# Patient Record
Sex: Female | Born: 1990 | Race: Black or African American | Hispanic: No | Marital: Single | Smoking: Current every day smoker
Health system: Southern US, Community
[De-identification: ages and names within clinical notes are randomized; demographics above are authoritative.]

## PROBLEM LIST (undated history)

## (undated) DIAGNOSIS — K5900 Constipation, unspecified: Secondary | ICD-10-CM

## (undated) DIAGNOSIS — N730 Acute parametritis and pelvic cellulitis: Secondary | ICD-10-CM

---
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
## 2010-05-08 ENCOUNTER — Emergency Department (HOSPITAL_COMMUNITY)
Admission: EM | Admit: 2010-05-08 | Discharge: 2010-05-08 | Disposition: A | Discharge status: Home / Self Care | Payer: No Typology Code available for payment source | Attending: Emergency Medicine | Admitting: Emergency Medicine

## 2010-05-08 DIAGNOSIS — F1210 Cannabis abuse, uncomplicated: Secondary | ICD-10-CM | POA: Insufficient documentation

## 2010-05-08 DIAGNOSIS — T148XXA Other injury of unspecified body region, initial encounter: Secondary | ICD-10-CM | POA: Insufficient documentation

## 2010-05-08 DIAGNOSIS — M25559 Pain in unspecified hip: Secondary | ICD-10-CM | POA: Insufficient documentation

## 2010-05-08 DIAGNOSIS — F17200 Nicotine dependence, unspecified, uncomplicated: Secondary | ICD-10-CM | POA: Insufficient documentation

## 2010-05-08 DIAGNOSIS — S7010XA Contusion of unspecified thigh, initial encounter: Secondary | ICD-10-CM | POA: Insufficient documentation

## 2010-05-13 ENCOUNTER — Emergency Department (HOSPITAL_COMMUNITY)
Admission: EM | Admit: 2010-05-13 | Discharge: 2010-05-13 | Disposition: A | Discharge status: Home / Self Care | Payer: No Typology Code available for payment source | Attending: Emergency Medicine | Admitting: Emergency Medicine

## 2010-05-13 ENCOUNTER — Emergency Department (HOSPITAL_COMMUNITY): Payer: No Typology Code available for payment source

## 2010-05-13 DIAGNOSIS — Y92410 Unspecified street and highway as the place of occurrence of the external cause: Secondary | ICD-10-CM | POA: Insufficient documentation

## 2010-05-13 DIAGNOSIS — IMO0002 Reserved for concepts with insufficient information to code with codable children: Secondary | ICD-10-CM | POA: Insufficient documentation

## 2010-05-13 DIAGNOSIS — S139XXA Sprain of joints and ligaments of unspecified parts of neck, initial encounter: Secondary | ICD-10-CM | POA: Insufficient documentation

## 2010-05-13 DIAGNOSIS — M25519 Pain in unspecified shoulder: Secondary | ICD-10-CM | POA: Insufficient documentation

## 2012-03-15 ENCOUNTER — Encounter (HOSPITAL_COMMUNITY): Payer: Self-pay | Admitting: *Deleted

## 2012-03-15 ENCOUNTER — Emergency Department (INDEPENDENT_AMBULATORY_CARE_PROVIDER_SITE_OTHER)
Admission: EM | Admit: 2012-03-15 | Discharge: 2012-03-15 | Disposition: A | Discharge status: Home / Self Care | Payer: Self-pay | Source: Home / Self Care

## 2012-03-15 ENCOUNTER — Other Ambulatory Visit (HOSPITAL_COMMUNITY)
Admission: RE | Admit: 2012-03-15 | Discharge: 2012-03-15 | Disposition: A | Discharge status: Home / Self Care | Payer: Self-pay | Source: Ambulatory Visit | Attending: Family Medicine | Admitting: Family Medicine

## 2012-03-15 DIAGNOSIS — Z113 Encounter for screening for infections with a predominantly sexual mode of transmission: Secondary | ICD-10-CM | POA: Insufficient documentation

## 2012-03-15 DIAGNOSIS — N730 Acute parametritis and pelvic cellulitis: Secondary | ICD-10-CM

## 2012-03-15 DIAGNOSIS — N760 Acute vaginitis: Secondary | ICD-10-CM | POA: Insufficient documentation

## 2012-03-15 LAB — POCT URINALYSIS DIP (DEVICE)
Bilirubin Urine: NEGATIVE
Glucose, UA: NEGATIVE mg/dL
Hgb urine dipstick: NEGATIVE
Nitrite: NEGATIVE
Urobilinogen, UA: 0.2 mg/dL (ref 0.0–1.0)

## 2012-03-15 MED ORDER — AZITHROMYCIN 250 MG PO TABS: 1000.0000 mg | ORAL_TABLET | Freq: Every day | ORAL | Status: DC

## 2012-03-15 MED ORDER — CEFTRIAXONE SODIUM 250 MG IJ SOLR
250.0000 mg | Freq: Once | INTRAMUSCULAR | Status: AC
  Administered 2012-03-15: 250 mg via INTRAMUSCULAR

## 2012-03-15 MED ORDER — AZITHROMYCIN 250 MG PO TABS
1000.0000 mg | ORAL_TABLET | Freq: Once | ORAL | Status: AC
  Administered 2012-03-15: 1000 mg via ORAL

## 2012-03-15 MED ORDER — CEFTRIAXONE SODIUM 250 MG IJ SOLR: 250.0000 mg | Freq: Once | INTRAMUSCULAR | Status: DC

## 2012-03-15 MED ORDER — METRONIDAZOLE 500 MG PO TABS: 500.0000 mg | ORAL_TABLET | Freq: Two times a day (BID) | ORAL | Status: DC

## 2012-03-15 MED ORDER — AZITHROMYCIN 250 MG PO TABS
ORAL_TABLET | ORAL | Status: AC
  Filled 2012-03-15: qty 4

## 2012-03-15 MED ORDER — CEFTRIAXONE SODIUM 250 MG IJ SOLR
INTRAMUSCULAR | Status: AC
  Filled 2012-03-15: qty 250

## 2012-03-15 NOTE — ED Notes (Signed)
Pt reports    Symptoms  Of  Rectal     Pain  unreleived  By         OTC  Ointment  As  Well  As       Lower  abd  Cramping     And  A  Vaginal  Discharge  As  Well    Pt  Walked  uright with a  Steady  Fluid  Gait  She  Also reports    Pain in legs  But  denys  Any  Injury

## 2012-03-15 NOTE — ED Provider Notes (Signed)
History     CSN: [id number]  Arrival date & time 03/15/12  1310   First MD Initiated Contact with Patient 03/15/12 1453      Chief Complaint  Patient presents with  . Vaginal Discharge    (Consider location/radiation/quality/duration/timing/severity/associated sxs/prior treatment) HPI Comments: 22-year-old female states that she had a small sore in her buttock crack a few days ago and it hurts when she coughs. She thinks is a little larger now. She also is complaining of pelvic pain and cramping for approximately one month associated with a vaginal discharge. She denies urinary symptoms such as dysuria or urinary frequency. She denies fever, vomiting, diarrhea or constipation.   History reviewed. No pertinent past medical history.  History reviewed. No pertinent past surgical history.  No family history on file.  History  Substance Use Topics  . Smoking status: Current Every Day Smoker  . Smokeless tobacco: Not on file  . Alcohol Use: Yes    OB History   Grav Para Term Preterm Abortions TAB SAB Ect Mult Living                  Review of Systems  Constitutional: Negative.   HENT: Negative.   Respiratory: Negative.   Cardiovascular: Negative.   Gastrointestinal: Positive for rectal pain. Negative for nausea, vomiting, abdominal pain, diarrhea, constipation, abdominal distention and anal bleeding.  Genitourinary: Positive for vaginal discharge and pelvic pain. Negative for dysuria, urgency, frequency and decreased urine volume.  Musculoskeletal: Negative.   Skin: Negative.   Neurological: Negative.   Psychiatric/Behavioral: Negative.     Allergies  Review of patient's allergies indicates not on file.  Home Medications   Current Outpatient Rx  Name  Route  Sig  Dispense  Refill  . cefTRIAXone (ROCEPHIN) 250 MG injection   Intramuscular   Inject 250 mg into the muscle once.  FOR IM use in LARGE MUSCLE MASS   1 each   0     BP 130/84  Pulse 71  Temp(Src) 98  F (36.7 C) (Oral)  Resp 20  SpO2 100%  LMP 02/29/2012  Physical Exam  Nursing note and vitals reviewed. Constitutional: She is oriented to person, place, and time. She appears well-developed and well-nourished. No distress.  Eyes: Conjunctivae and EOM are normal.  Neck: Normal range of motion.  Cardiovascular: Normal rate, regular rhythm and normal heart sounds.   Pulmonary/Chest: Breath sounds normal. She is in respiratory distress.  Abdominal: Soft. She exhibits no distension and no mass. There is no tenderness. There is no rebound and no guarding.  Genitourinary:  Mild tenderness with external pelvic exam. Manual pressure over the suprapubic area and right pelvis produces mild tenderness. NEFG, Copious amount pale green , thin discharge coating vaginal walls and cervix. Cervix posterior and left of midline. Ectocervix red, inflammed at 6 o clock position. R adnexal tendrness, no masses.   Anorectal without lesions, bleeding, swelling, nodules, discoloration or evidence of hemorrhoids. Normal exam.  Lymphadenopathy:    She has no cervical adenopathy.  Neurological: She is alert and oriented to person, place, and time. She exhibits normal muscle tone.  Skin: Skin is warm and dry.  Psychiatric: She has a normal mood and affect.    ED Course  Procedures (including critical care time)  Labs Reviewed  POCT URINALYSIS DIP (DEVICE) - Abnormal; Notable for the following:    Leukocytes, UA MODERATE (*)    All other components within normal limits  POCT PREGNANCY, URINE  URINE CYTOLOGY ANCILLARY  ONLY   No results found. Results for orders placed during the hospital encounter of 03/15/12  POCT URINALYSIS DIP (DEVICE)      Result Value Range   Glucose, UA NEGATIVE  NEGATIVE mg/dL   Bilirubin Urine NEGATIVE  NEGATIVE   Ketones, ur NEGATIVE  NEGATIVE mg/dL   Specific Gravity, Urine 1.015  1.005 - 1.030   Hgb urine dipstick NEGATIVE  NEGATIVE   pH 7.0  5.0 - 8.0   Protein, ur  NEGATIVE  NEGATIVE mg/dL   Urobilinogen, UA 0.2  0.0 - 1.0 mg/dL   Nitrite NEGATIVE  NEGATIVE   Leukocytes, UA MODERATE (*) NEGATIVE  POCT PREGNANCY, URINE      Result Value Range   Preg Test, Ur NEGATIVE  NEGATIVE      1. PID (acute pelvic inflammatory disease)       MDM  Rocephin 250 mg IM now Azithromycin 1 g by mouth now Obtained swabs for PCR testing Advil 400 600 mg every 6-8 hours when necessary abdominal pain or cramping. Drink plenty of fluids stay well hydrated We will call results of swab testing and treat over the phone as needed. For any new symptoms problems or worsening such as fever increased abdominal or pelvic pain, vomiting or other symptoms go to Emergency department.  03/16/12. Positive for BV. FLagyl 500 bid for 7 d.        Hayden Rasmussen, NP 03/15/12 2115  Hayden Rasmussen, NP 03/17/12 [phone]

## 2012-03-19 ENCOUNTER — Telehealth (HOSPITAL_COMMUNITY): Payer: Self-pay | Admitting: *Deleted

## 2012-03-19 NOTE — ED Notes (Signed)
3/14  GC/Chlamydia pos., Affirm: Gardnerella pos., Candida and Trich neg.  Pt. adequately treated with Rocephin, Zithromax and Flagyl.  I called pt. and left a message to call.  3/16  Pt. called back.  Pt. verified x 2 and given results. Pt. told she was adequately treated with Rocephin, Zithromax and Flagyl.  Pt. instructed to notify her partner, no sex for 1 week and to practice safe sex. Pt. told she can get HIV testing at the [facility] STD clinic, by appointment.  Pt. states she had unprotected sex with same partner last night. Pt. instructed to get retested here or at the [facility] and no sex until partner is treated as well.  DHHS form x 2 completed and faxed to the [facility]. Vassie Moselle 03/19/2012

## 2012-03-21 NOTE — ED Provider Notes (Signed)
Medical screening examination/treatment/procedure(s) were performed by resident physician or non-physician practitioner and as supervising physician I was immediately available for consultation/collaboration.   Zannie Runkle DOUGLAS MD.   Niccole Witthuhn D Masato Pettie, MD 03/21/12 1146 

## 2012-05-09 ENCOUNTER — Encounter (HOSPITAL_COMMUNITY): Payer: Self-pay | Admitting: *Deleted

## 2012-05-09 ENCOUNTER — Emergency Department (HOSPITAL_COMMUNITY)
Admission: EM | Admit: 2012-05-09 | Discharge: 2012-05-10 | Disposition: A | Discharge status: Home / Self Care | Payer: Self-pay | Attending: Emergency Medicine | Admitting: Emergency Medicine

## 2012-05-09 ENCOUNTER — Emergency Department (HOSPITAL_COMMUNITY): Payer: Self-pay

## 2012-05-09 DIAGNOSIS — J159 Unspecified bacterial pneumonia: Secondary | ICD-10-CM | POA: Insufficient documentation

## 2012-05-09 DIAGNOSIS — J189 Pneumonia, unspecified organism: Secondary | ICD-10-CM

## 2012-05-09 DIAGNOSIS — Z8742 Personal history of other diseases of the female genital tract: Secondary | ICD-10-CM | POA: Insufficient documentation

## 2012-05-09 DIAGNOSIS — R071 Chest pain on breathing: Secondary | ICD-10-CM | POA: Insufficient documentation

## 2012-05-09 DIAGNOSIS — F17200 Nicotine dependence, unspecified, uncomplicated: Secondary | ICD-10-CM | POA: Insufficient documentation

## 2012-05-09 DIAGNOSIS — R0789 Other chest pain: Secondary | ICD-10-CM

## 2012-05-09 LAB — D-DIMER, QUANTITATIVE: D-Dimer, Quant: 0.79 ug/mL-FEU — ABNORMAL HIGH (ref 0.00–0.48)

## 2012-05-09 MED ORDER — IOHEXOL 350 MG/ML SOLN
100.0000 mL | Freq: Once | INTRAVENOUS | Status: AC | PRN
  Administered 2012-05-09: 100 mL via INTRAVENOUS

## 2012-05-09 MED ORDER — OXYCODONE-ACETAMINOPHEN 5-325 MG PO TABS
2.0000 | ORAL_TABLET | Freq: Once | ORAL | Status: AC
  Administered 2012-05-09: 2 via ORAL
  Filled 2012-05-09: qty 2

## 2012-05-09 NOTE — ED Provider Notes (Signed)
History    This chart was scribed for non-physician practitioner Dierdre Forth, PA-C working with Gerhard Munch, MD by Gerlean Ren, ED Scribe. This patient was seen in room WTR8/WTR8 and the patient's care was started at 9:20 PM.    CSN: [id number]  Arrival date & time 05/09/12  2037   First MD Initiated Contact with Patient 05/09/12 2106      Chief Complaint  Patient presents with  . Rib Pain      The history is provided by the patient. No language interpreter was used.  Catherine Meyer is a 22 y.o. female who presents to the Emergency Department complaining of right side rib aching pain localized under the right breast that began at 7:00 PM not during any heavy lifting or any unusual or exertional activities.  Pain is worsened when laughing and taking deep breaths.  Pt denies any true SOB, pain in lower extremities or calves, abdominal pain, nausea, emesis.  No h/o DVT or PE.  Pt denies recent illness.  No recent surgeries.  No birth control.  No recent long trips.  No unusual foods eaten today.  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  Pt is a current everyday smoker.  No PCP.     History reviewed. No pertinent past medical history.  History reviewed. No pertinent past surgical history.  History reviewed. No pertinent family history.  History  Substance Use Topics  . Smoking status: Current Every Day Smoker    Types: Cigarettes  . Smokeless tobacco: Not on file  . Alcohol Use: Yes    No OB history provided.   Review of Systems  Constitutional: Negative for fever, diaphoresis, appetite change, fatigue and unexpected weight change.  HENT: Negative for mouth sores and neck stiffness.   Eyes: Negative for visual disturbance.  Respiratory: Positive for chest tightness. Negative for cough, shortness of breath and wheezing.   Cardiovascular: Positive for chest pain.  Gastrointestinal: Negative for nausea, vomiting, abdominal pain, diarrhea and constipation.  Endocrine:  Negative for polydipsia, polyphagia and polyuria.  Genitourinary: Negative for dysuria, urgency, frequency and hematuria.  Musculoskeletal: Negative for back pain.       Positive right rib pain  Skin: Negative for rash.  Allergic/Immunologic: Negative for immunocompromised state.  Neurological: Negative for syncope, light-headedness and headaches.  Hematological: Does not bruise/bleed easily.  Psychiatric/Behavioral: Negative for sleep disturbance. The patient is not nervous/anxious.     Allergies  Review of patient's allergies indicates no known allergies.  Home Medications   Current Outpatient Rx  Name  Route  Sig  Dispense  Refill  . levofloxacin (LEVAQUIN) 750 MG tablet   Oral   Take 1 tablet (750 mg total) by mouth daily. X 7 days   7 tablet   0   . oxyCODONE-acetaminophen (PERCOCET/ROXICET) 5-325 MG per tablet   Oral   Take 1 tablet by mouth every 4 (four) hours as needed for pain.   10 tablet   0     BP 138/89  Pulse 97  Temp(Src) 98.3 F (36.8 C) (Oral)  Resp 21  Ht 5\' 3"  (1.6 m)  SpO2 100%  LMP 05/01/2012  Physical Exam  Nursing note and vitals reviewed. Constitutional: She is oriented to person, place, and time. She appears well-developed and well-nourished. She appears distressed.  Pt tearful throughout much of exam and very restless and uncomfortable feeling  HENT:  Head: Normocephalic and atraumatic.  Mouth/Throat: Oropharynx is clear and moist. No oropharyngeal exudate.  Eyes: Conjunctivae are normal.  Pupils are equal, round, and reactive to light. No scleral icterus.  Neck: Normal range of motion. Neck supple.  Cardiovascular: Normal rate, regular rhythm and intact distal pulses.   Pulmonary/Chest: Effort normal and breath sounds normal. No respiratory distress. She has no wheezes. She has no rales. She exhibits tenderness (very mild - pts chest pain is out of proportion to her exam and to the pain felt during exam).  Pt taking shallow breaths Pt  without tachypnea  Abdominal: Soft. Bowel sounds are normal. She exhibits no mass. There is no tenderness. There is no rebound and no guarding.  Musculoskeletal: Normal range of motion. She exhibits no edema.  Neurological: She is alert and oriented to person, place, and time. She exhibits normal muscle tone. Coordination normal.  Speech is clear and goal oriented Moves extremities without ataxia  Skin: Skin is warm and dry. No rash noted. She is not diaphoretic. No erythema.  Psychiatric: She has a normal mood and affect. Her behavior is normal.    ED Course  Procedures (including critical care time) DIAGNOSTIC STUDIES: Oxygen Saturation is 100% on room air, normal by my interpretation.    COORDINATION OF CARE: 9:28 PM- Informed pt that the XR is negative for fractures and that I have very low suspicion of PE, but that I will discuss case with attending physician to determine further treatment plan.  Pt verbalizes understanding.      Results for orders placed during the hospital encounter of 05/09/12  D-DIMER, QUANTITATIVE      Result Value Range   D-Dimer, Quant 0.79 (*) 0.00 - 0.48 ug/mL-FEU   Dg Ribs Unilateral W/chest Right  05/09/2012  *RADIOLOGY REPORT*  Clinical Data: Anterior rib pain  RIGHT RIBS AND CHEST - 3+ VIEW  Comparison: None.  Findings: Normal mediastinum and heart silhouette.  Costophrenic angles are clear.  No effusion or pneumothorax.  Dedicated views of the right ribs demonstrate no displaced rib fracture.  IMPRESSION: Normal chest radiograph.  No rib abnormality.   Original Report Authenticated By: Genevive Bi, M.D.    Ct Angio Chest Pe W/cm &/or Wo Cm  05/09/2012  *RADIOLOGY REPORT*  Clinical Data: Right-sided chest pain.  Concern for pulmonary embolism.  CT ANGIOGRAPHY CHEST  Technique:  Multidetector CT imaging of the chest using the standard protocol during bolus administration of intravenous contrast. Multiplanar reconstructed images including MIPs were  obtained and reviewed to evaluate the vascular anatomy.  Contrast: OMNIPAQUE IOHEXOL 350 MG/ML SOLN  Comparison: Chest radiograph 05/09/2012  Findings: There is no filling defects within the pulmonary arteries suggest acute pulmonary embolism.  No acute findings of the aorta or great vessels.  No pericardial fluid.  Esophagus is normal.  No axillary supraclavicular lymphadenopathy.  No mediastinal or hilar lymphadenopathy.  Review of the lung parenchyma demonstrates no pleural fluid or pneumothorax.  There is a subtle regional ground-glass opacity in the right lower lobe (image 53).  No focal consolidation.  Limited view of the upper abdomen is unremarkable.  Limited view of the skeleton is unremarkable.  IMPRESSION:  1.  No evidence acute pulmonary embolism. 2.  Regional ground-glass opacity in the right lower lobe could represent a focus of focal edema,   inflammation or infection.   Original Report Authenticated By: Genevive Bi, M.D.       1. Community acquired pneumonia   2. Chest wall pain       MDM  Andria Rhein presents with history and physical consistent with chest wall pain  however on exam pt is restless, appears uncomfortable and pt's pain is out of proportion to her exam as she only has mild chest wall tenderness to palpation.  Pt is without tachycardia, tachypnea or hypoxia but concern for PE or underlying pathology is present.  CXR is negative for acute infiltrate or edema.  Will provide pain control and obtain d-dimer.    Pt d-dimer positive, she is tearful on re-exam with c/o increased pain though analgesic was just given.    CT chest with regional ground-glass opacity in the right lower lobe could represent a focus of focal edema, inflammation or infection.  Given the pts unremarkable medical Hx and denial of HIV risk factors we will treat with outpatient abx and have her f/u with [facility] for re-evaluation and further discussion of her CT findings.  Pt  remains without hypoxia, tachycardia or tachypnea. I have also discussed reasons to return immediately to the ER.  Patient expresses understanding and agrees with plan.  Dr. Gerhard Munch was consulted and agrees with the plan.     I personally performed the services described in this documentation, which was scribed in my presence. The recorded information has been reviewed and is accurate.   Dahlia Client Brynlyn Dade, PA-C 05/10/12 [phone]

## 2012-05-09 NOTE — ED Notes (Signed)
Pt transferred to room for continued care.

## 2012-05-09 NOTE — ED Notes (Signed)
Pt c/o pain to right rib cage. Reports pain started while at work. Denies SOB. Reports pain is worse with deep breath. Pt tearful in triage.

## 2012-05-10 MED ORDER — LEVOFLOXACIN 750 MG PO TABS: 750.0000 mg | ORAL_TABLET | Freq: Every day | ORAL | Status: DC

## 2012-05-10 MED ORDER — OXYCODONE-ACETAMINOPHEN 5-325 MG PO TABS: 1.0000 | ORAL_TABLET | ORAL | Status: DC | PRN

## 2012-05-11 NOTE — ED Provider Notes (Signed)
  Medical screening examination/treatment/procedure(s) were performed by non-physician practitioner and as supervising physician I was immediately available for consultation/collaboration.    Gerhard Munch, MD 05/11/12 [phone]

## 2012-07-31 DIAGNOSIS — Z3202 Encounter for pregnancy test, result negative: Secondary | ICD-10-CM | POA: Insufficient documentation

## 2012-07-31 DIAGNOSIS — F17200 Nicotine dependence, unspecified, uncomplicated: Secondary | ICD-10-CM | POA: Insufficient documentation

## 2012-07-31 DIAGNOSIS — R109 Unspecified abdominal pain: Secondary | ICD-10-CM | POA: Insufficient documentation

## 2012-08-01 ENCOUNTER — Emergency Department (HOSPITAL_COMMUNITY)
Admission: EM | Admit: 2012-08-01 | Discharge: 2012-08-01 | Disposition: A | Discharge status: Home / Self Care | Payer: Self-pay | Attending: Emergency Medicine | Admitting: Emergency Medicine

## 2012-08-01 ENCOUNTER — Encounter (HOSPITAL_COMMUNITY): Payer: Self-pay | Admitting: *Deleted

## 2012-08-01 ENCOUNTER — Emergency Department (HOSPITAL_COMMUNITY): Payer: Self-pay

## 2012-08-01 DIAGNOSIS — R109 Unspecified abdominal pain: Secondary | ICD-10-CM

## 2012-08-01 LAB — URINALYSIS, ROUTINE W REFLEX MICROSCOPIC
Glucose, UA: NEGATIVE mg/dL
Specific Gravity, Urine: 1.024 (ref 1.005–1.030)
Urobilinogen, UA: 1 mg/dL (ref 0.0–1.0)
pH: 6 (ref 5.0–8.0)

## 2012-08-01 LAB — CBC WITH DIFFERENTIAL/PLATELET
Basophils Relative: 0 % (ref 0–1)
Hemoglobin: 12 g/dL (ref 12.0–15.0)
Lymphocytes Relative: 19 % (ref 12–46)
Lymphs Abs: 1.7 10*3/uL (ref 0.7–4.0)
Monocytes Relative: 10 % (ref 3–12)
Neutro Abs: 6 10*3/uL (ref 1.7–7.7)
Neutrophils Relative %: 70 % (ref 43–77)
RBC: 4.27 MIL/uL (ref 3.87–5.11)

## 2012-08-01 LAB — COMPREHENSIVE METABOLIC PANEL
ALT: 9 U/L (ref 0–35)
AST: 15 U/L (ref 0–37)
CO2: 24 mEq/L (ref 19–32)
Calcium: 9 mg/dL (ref 8.4–10.5)
Creatinine, Ser: 0.64 mg/dL (ref 0.50–1.10)
GFR calc Af Amer: >90 mL/min (ref >90)
GFR calc non Af Amer: >90 mL/min (ref >90)
Sodium: 137 mEq/L (ref 135–145)
Total Protein: 7.5 g/dL (ref 6.0–8.3)

## 2012-08-01 LAB — POCT PREGNANCY, URINE: Preg Test, Ur: NEGATIVE

## 2012-08-01 LAB — LIPASE, BLOOD: Lipase: 13 U/L (ref 11–59)

## 2012-08-01 LAB — URINE MICROSCOPIC-ADD ON

## 2012-08-01 MED ORDER — IOHEXOL 300 MG/ML  SOLN
25.0000 mL | INTRAMUSCULAR | Status: AC
  Administered 2012-08-01: 25 mL via ORAL

## 2012-08-01 MED ORDER — MAGNESIUM CITRATE PO SOLN
1.0000 | Freq: Once | ORAL | Status: AC
  Administered 2012-08-01: 1 via ORAL
  Filled 2012-08-01: qty 296

## 2012-08-01 MED ORDER — SODIUM CHLORIDE 0.9 % IV SOLN
INTRAVENOUS | Status: DC
  Administered 2012-08-01: 05:00:00 via INTRAVENOUS

## 2012-08-01 MED ORDER — IOHEXOL 300 MG/ML  SOLN
100.0000 mL | Freq: Once | INTRAMUSCULAR | Status: AC | PRN
  Administered 2012-08-01: 100 mL via INTRAVENOUS

## 2012-08-01 NOTE — ED Notes (Signed)
MD at bedside. 

## 2012-08-01 NOTE — ED Provider Notes (Signed)
CSN: [id number]     Arrival date & time 07/31/12  2342 History     First MD Initiated Contact with Patient 08/01/12 [phone]     Chief Complaint  Patient presents with  . Abdominal Pain   (Consider location/radiation/quality/duration/timing/severity/associated sxs/prior Treatment) Patient is a 22 y.o. female presenting with abdominal pain. The history is provided by the patient.  Abdominal Pain Associated symptoms include abdominal pain.  She complains of a right upper quadrant pain which started about a week ago and is now starting to spread to the right or quadrant. Pain is dull and tends to wax and wane. She rates the current pain at 6/10 but it has been as severe as 8/10 and pain has in general been getting worse. It seems to be worse when she is supine. She's not noticed with her eating fracture or not. She denies nausea or vomiting. She denies fever, chills, sweats. She has not taken anything to try and help it. Similar pain in May of this year was diagnosed as pneumonia. She denies any cough but states that she did not have cough or fever when she had pneumonia. She denies any constipation or diarrhea.  History reviewed. No pertinent past medical history. History reviewed. No pertinent past surgical history. No family history on file. History  Substance Use Topics  . Smoking status: Current Every Day Smoker    Types: Cigarettes  . Smokeless tobacco: Not on file  . Alcohol Use: Yes   OB History   Grav Para Term Preterm Abortions TAB SAB Ect Mult Living                 Review of Systems  Gastrointestinal: Positive for abdominal pain.  All other systems reviewed and are negative.    Allergies  Review of patient's allergies indicates no known allergies.  Home Medications  No current outpatient prescriptions on file. BP 129/77  Pulse 86  Temp(Src) 98.1 F (36.7 C) (Oral)  Resp 18  SpO2 100%  LMP 07/30/2012 Physical Exam  Nursing note and vitals reviewed.  22 year old  female, resting comfortably and in no acute distress. Vital signs are normal. Oxygen saturation is 100%, which is normal. Head is normocephalic and atraumatic. PERRLA, EOMI. Oropharynx is clear. Neck is nontender and supple without adenopathy or JVD. Back is nontender and there is no CVA tenderness. Lungs are clear without rales, wheezes, or rhonchi. Chest is nontender. Heart has regular rate and rhythm without murmur. Abdomen is soft, flat, with moderate right upper quadrant and right lower quadrant tenderness. There is a plus/minus Murphy sign present, but maximum tenderness is over McBurney's area in the right lower cautery. There is no rebound or guarding. There are no masses or hepatosplenomegaly and peristalsis is normoactive. Extremities have no cyanosis or edema, full range of motion is present. Skin is warm and dry without rash. Neurologic: Mental status is normal, cranial nerves are intact, there are no motor or sensory deficits.  ED Course   Procedures (including critical care time)  Results for orders placed during the hospital encounter of 08/01/12  COMPREHENSIVE METABOLIC PANEL      Result Value Range   Sodium 137  135 - 145 mEq/L   Potassium 3.4 (*) 3.5 - 5.1 mEq/L   Chloride 104  96 - 112 mEq/L   CO2 24  19 - 32 mEq/L   Glucose, Bld 89  70 - 99 mg/dL   BUN 9  6 - 23 mg/dL   Creatinine, Ser  0.64  0.50 - 1.10 mg/dL   Calcium 9.0  8.4 - 09.8 mg/dL   Total Protein 7.5  6.0 - 8.3 g/dL   Albumin 4.0  3.5 - 5.2 g/dL   AST 15  0 - 37 U/L   ALT 9  0 - 35 U/L   Alkaline Phosphatase 67  39 - 117 U/L   Total Bilirubin 0.2 (*) 0.3 - 1.2 mg/dL   GFR calc non Af Amer >90  >90 mL/min   GFR calc Af Amer >90  >90 mL/min  LIPASE, BLOOD      Result Value Range   Lipase 13  11 - 59 U/L  CBC WITH DIFFERENTIAL      Result Value Range   WBC 8.6  4.0 - 10.5 K/uL   RBC 4.27  3.87 - 5.11 MIL/uL   Hemoglobin 12.0  12.0 - 15.0 g/dL   HCT 11.9  14.7 - 82.9 %   MCV 84.3  78.0 - 100.0 fL    MCH 28.1  26.0 - 34.0 pg   MCHC 33.3  30.0 - 36.0 g/dL   RDW 56.2  13.0 - 86.5 %   Platelets 247  150 - 400 K/uL   Neutrophils Relative % 70  43 - 77 %   Neutro Abs 6.0  1.7 - 7.7 K/uL   Lymphocytes Relative 19  12 - 46 %   Lymphs Abs 1.7  0.7 - 4.0 K/uL   Monocytes Relative 10  3 - 12 %   Monocytes Absolute 0.8  0.1 - 1.0 K/uL   Eosinophils Relative 1  0 - 5 %   Eosinophils Absolute 0.1  0.0 - 0.7 K/uL   Basophils Relative 0  0 - 1 %   Basophils Absolute 0.0  0.0 - 0.1 K/uL  URINALYSIS, ROUTINE W REFLEX MICROSCOPIC      Result Value Range   Color, Urine YELLOW  YELLOW   APPearance CLEAR  CLEAR   Specific Gravity, Urine 1.024  1.005 - 1.030   pH 6.0  5.0 - 8.0   Glucose, UA NEGATIVE  NEGATIVE mg/dL   Hgb urine dipstick LARGE (*) NEGATIVE   Bilirubin Urine NEGATIVE  NEGATIVE   Ketones, ur NEGATIVE  NEGATIVE mg/dL   Protein, ur NEGATIVE  NEGATIVE mg/dL   Urobilinogen, UA 1.0  0.0 - 1.0 mg/dL   Nitrite NEGATIVE  NEGATIVE   Leukocytes, UA NEGATIVE  NEGATIVE  URINE MICROSCOPIC-ADD ON      Result Value Range   Squamous Epithelial / LPF RARE  RARE   RBC / HPF 11-20  <3 RBC/hpf  POCT PREGNANCY, URINE      Result Value Range   Preg Test, Ur NEGATIVE  NEGATIVE   Ct Abdomen Pelvis W Contrast  08/01/2012   *RADIOLOGY REPORT*  Clinical Data: Abdominal pain.  Right upper quadrant pain since last Wednesday.  CT ABDOMEN AND PELVIS WITH CONTRAST  Technique:  Multidetector CT imaging of the abdomen and pelvis was performed following the standard protocol during bolus administration of intravenous contrast.  Contrast: OMNIPAQUE IOHEXOL 300 MG/ML  SOLN  Comparison: None.  Findings:  BODY WALL: Unremarkable.  LOWER CHEST:  Mediastinum: Unremarkable.  Lungs/pleura: No consolidation.  ABDOMEN/PELVIS:  Liver: No focal abnormality.  Biliary: No evidence of biliary obstruction or stone.  Pancreas: Unremarkable.  Spleen: Unremarkable.  Adrenals: Unremarkable.  Kidneys and ureters: No  hydronephrosis or stone.  Bladder: Unremarkable.  Bowel: No obstruction. Normal appendix.  Large stool burden.  Retroperitoneum: No mass  or adenopathy.  Peritoneum: No free fluid or gas.  Reproductive: Unremarkable.  Vascular: No acute abnormality.  OSSEOUS: No acute abnormalities.  IMPRESSION:  1. No acute intra-abdominal abnormality.  Normal appendix. 2.  Large stool burden.   Original Report Authenticated By: Tiburcio Pea     1. Abdominal pain     MDM  Abdominal pain of uncertain cause. Old records are reviewed and diagnosis of pneumonia was placed on a CT angiogram of the chest obtained because of a positive d-dimer. With positive Murphy sign, concern was present for biliary tract disease. Limited bedside ultrasound was done which showed normal gallbladder without evidence of cholelithiasis. Images were saved. It was decided to get a CT of her abdomen to rule out appendicitis. Initial laboratory workup is unremarkable including normal WBC. However, her maximum tenderness is over McBurney's area.  CT is negative for appendicitis and other significant pathology other than a large stool. It is possible that her pain is related to constipation. She was given a dose of magnesium citrate and discharged. She's reassured of the negative findings of her workup.  Dione Booze, MD 08/01/12 [phone]

## 2012-08-01 NOTE — ED Notes (Signed)
C/o RUQ pain, onset last Wednesday, also mentions sob and fever. (denies: urinary or vaginal sx, nvd, dizziness or bleeding), has tried ibuprofen w/o change in sx. Last ate 0200. Last BM Saturday (normal).

## 2012-09-12 ENCOUNTER — Emergency Department (HOSPITAL_COMMUNITY)
Admission: EM | Admit: 2012-09-12 | Discharge: 2012-09-12 | Disposition: A | Discharge status: Home / Self Care | Payer: BC Managed Care – PPO | Attending: Emergency Medicine | Admitting: Emergency Medicine

## 2012-09-12 ENCOUNTER — Encounter (HOSPITAL_COMMUNITY): Payer: Self-pay | Admitting: *Deleted

## 2012-09-12 DIAGNOSIS — R1011 Right upper quadrant pain: Secondary | ICD-10-CM | POA: Insufficient documentation

## 2012-09-12 DIAGNOSIS — F17200 Nicotine dependence, unspecified, uncomplicated: Secondary | ICD-10-CM | POA: Insufficient documentation

## 2012-09-12 DIAGNOSIS — Z3202 Encounter for pregnancy test, result negative: Secondary | ICD-10-CM | POA: Insufficient documentation

## 2012-09-12 DIAGNOSIS — R1013 Epigastric pain: Secondary | ICD-10-CM | POA: Insufficient documentation

## 2012-09-12 DIAGNOSIS — A64 Unspecified sexually transmitted disease: Secondary | ICD-10-CM

## 2012-09-12 DIAGNOSIS — K5900 Constipation, unspecified: Secondary | ICD-10-CM | POA: Insufficient documentation

## 2012-09-12 DIAGNOSIS — A5909 Other urogenital trichomoniasis: Secondary | ICD-10-CM | POA: Insufficient documentation

## 2012-09-12 DIAGNOSIS — R300 Dysuria: Secondary | ICD-10-CM | POA: Insufficient documentation

## 2012-09-12 DIAGNOSIS — A599 Trichomoniasis, unspecified: Secondary | ICD-10-CM

## 2012-09-12 DIAGNOSIS — N898 Other specified noninflammatory disorders of vagina: Secondary | ICD-10-CM | POA: Insufficient documentation

## 2012-09-12 DIAGNOSIS — A638 Other specified predominantly sexually transmitted diseases: Secondary | ICD-10-CM | POA: Insufficient documentation

## 2012-09-12 DIAGNOSIS — R112 Nausea with vomiting, unspecified: Secondary | ICD-10-CM | POA: Insufficient documentation

## 2012-09-12 HISTORY — DX: Constipation, unspecified: K59.00

## 2012-09-12 LAB — URINE MICROSCOPIC-ADD ON

## 2012-09-12 LAB — COMPREHENSIVE METABOLIC PANEL
ALT: 37 U/L — ABNORMAL HIGH (ref 0–35)
Albumin: 3.9 g/dL (ref 3.5–5.2)
Alkaline Phosphatase: 73 U/L (ref 39–117)
Glucose, Bld: 88 mg/dL (ref 70–99)
Potassium: 3.4 mEq/L — ABNORMAL LOW (ref 3.5–5.1)
Sodium: 135 mEq/L (ref 135–145)
Total Protein: 9.4 g/dL — ABNORMAL HIGH (ref 6.0–8.3)

## 2012-09-12 LAB — CBC WITH DIFFERENTIAL/PLATELET
Basophils Relative: 0 % (ref 0–1)
Eosinophils Absolute: 0 10*3/uL (ref 0.0–0.7)
Lymphs Abs: 1.7 10*3/uL (ref 0.7–4.0)
MCH: 27.7 pg (ref 26.0–34.0)
Neutrophils Relative %: 81 % — ABNORMAL HIGH (ref 43–77)
Platelets: 346 10*3/uL (ref 150–400)
RBC: 4.4 MIL/uL (ref 3.87–5.11)

## 2012-09-12 LAB — RPR: RPR Ser Ql: NONREACTIVE

## 2012-09-12 LAB — URINALYSIS, ROUTINE W REFLEX MICROSCOPIC
Bilirubin Urine: NEGATIVE
Glucose, UA: NEGATIVE mg/dL
Hgb urine dipstick: NEGATIVE
Specific Gravity, Urine: 1.02 (ref 1.005–1.030)
pH: 7 (ref 5.0–8.0)

## 2012-09-12 LAB — WET PREP, GENITAL: Yeast Wet Prep HPF POC: NONE SEEN

## 2012-09-12 LAB — HIV ANTIBODY (ROUTINE TESTING W REFLEX): HIV: NONREACTIVE

## 2012-09-12 MED ORDER — AZITHROMYCIN 250 MG PO TABS
1000.0000 mg | ORAL_TABLET | Freq: Once | ORAL | Status: AC
  Administered 2012-09-12: 1000 mg via ORAL
  Filled 2012-09-12: qty 4

## 2012-09-12 MED ORDER — ONDANSETRON HCL 4 MG/2ML IJ SOLN
4.0000 mg | Freq: Once | INTRAMUSCULAR | Status: AC
  Administered 2012-09-12: 4 mg via INTRAVENOUS
  Filled 2012-09-12: qty 2

## 2012-09-12 MED ORDER — CEFTRIAXONE SODIUM 250 MG IJ SOLR
250.0000 mg | Freq: Once | INTRAMUSCULAR | Status: AC
  Administered 2012-09-12: 250 mg via INTRAMUSCULAR
  Filled 2012-09-12: qty 250

## 2012-09-12 MED ORDER — LIDOCAINE HCL 1 % IJ SOLN
INTRAMUSCULAR | Status: AC
  Administered 2012-09-12: 1.2 mL
  Filled 2012-09-12: qty 20

## 2012-09-12 MED ORDER — MORPHINE SULFATE 4 MG/ML IJ SOLN
4.0000 mg | Freq: Once | INTRAMUSCULAR | Status: AC
  Administered 2012-09-12: 4 mg via INTRAVENOUS
  Filled 2012-09-12: qty 1

## 2012-09-12 MED ORDER — METRONIDAZOLE 500 MG PO TABS
2000.0000 mg | ORAL_TABLET | Freq: Once | ORAL | Status: AC
  Administered 2012-09-12: 2000 mg via ORAL
  Filled 2012-09-12: qty 4

## 2012-09-12 MED ORDER — SODIUM CHLORIDE 0.9 % IV BOLUS (SEPSIS)
1000.0000 mL | Freq: Once | INTRAVENOUS | Status: AC
  Administered 2012-09-12: 1000 mL via INTRAVENOUS

## 2012-09-12 NOTE — ED Provider Notes (Signed)
CSN: [id number]     Arrival date & time 09/12/12  0011 History   First MD Initiated Contact with Patient 09/12/12 0117     Chief Complaint  Patient presents with  . Back Pain  . Abdominal Pain   HPI  History provided by the patient. The patient is a 22 year old female who presents with complaints of worsening diffuse abdominal pain. Symptoms first began yesterday evening around 8:30 and worsened through the night and early this morning. She states pain is very severe and sharp. It does radiate some to her back. She reports having similar symptoms previously and has been worked up for this without any definite cause. She states she had ultrasound of her gallbladder 2 weeks ago by her PCP and was told this was normal. She has been told in past that she has been constipated but states she has been having up to 3 bowel movements a day without any change recently. She does report having some new vaginal discharge and some episodes of dysuria past week. Symptoms have been associated with nausea and one episode of vomiting. She denies any fever, chills or sweats. She denies any chest pain or shortness of breath.    Past Medical History  Diagnosis Date  . Constipation    No past surgical history on file. No family history on file. History  Substance Use Topics  . Smoking status: Current Every Day Smoker -- 0.50 packs/day    Types: Cigarettes  . Smokeless tobacco: Not on file  . Alcohol Use: Yes   OB History   Grav Para Term Preterm Abortions TAB SAB Ect Mult Living                 Review of Systems  Constitutional: Negative for fever, chills and diaphoresis.  Respiratory: Negative for cough and shortness of breath.   Gastrointestinal: Positive for nausea, vomiting and abdominal pain. Negative for diarrhea, constipation and blood in stool.  Genitourinary: Positive for dysuria and vaginal discharge. Negative for frequency, hematuria, flank pain, vaginal bleeding and menstrual problem.  All  other systems reviewed and are negative.    Allergies  Review of patient's allergies indicates no known allergies.  Home Medications  No current outpatient prescriptions on file. BP 119/81  Pulse 89  Temp(Src) 97.8 F (36.6 C) (Oral)  Resp 20  SpO2 96%  LMP 09/04/2012 Physical Exam  Nursing note and vitals reviewed. Constitutional: She is oriented to person, place, and time. She appears well-developed and well-nourished. No distress.  HENT:  Head: Normocephalic.  Cardiovascular: Normal rate and regular rhythm.   Pulmonary/Chest: Effort normal and breath sounds normal.  Abdominal: Soft. There is tenderness in the right upper quadrant and epigastric area. There is no rigidity, no rebound, no guarding, no CVA tenderness, no tenderness at McBurney's point and negative Murphy's sign.  Diffuse abdominal tenderness seems greatest in the right upper quadrant epigastric area.  Musculoskeletal: Normal range of motion.  Neurological: She is alert and oriented to person, place, and time.  Skin: Skin is warm and dry. No rash noted.  Psychiatric: She has a normal mood and affect. Her behavior is normal.    ED Course  Procedures   Results for orders placed during the hospital encounter of 09/12/12  CBC WITH DIFFERENTIAL      Result Value Range   WBC 12.4 (*) 4.0 - 10.5 K/uL   RBC 4.40  3.87 - 5.11 MIL/uL   Hemoglobin 12.2  12.0 - 15.0 g/dL   HCT 36.7  36.0 - 46.0 %   MCV 83.4  78.0 - 100.0 fL   MCH 27.7  26.0 - 34.0 pg   MCHC 33.2  30.0 - 36.0 g/dL   RDW 40.9  81.1 - 91.4 %   Platelets 346  150 - 400 K/uL   Neutrophils Relative % 81 (*) 43 - 77 %   Neutro Abs 10.1 (*) 1.7 - 7.7 K/uL   Lymphocytes Relative 14  12 - 46 %   Lymphs Abs 1.7  0.7 - 4.0 K/uL   Monocytes Relative 4  3 - 12 %   Monocytes Absolute 0.5  0.1 - 1.0 K/uL   Eosinophils Relative 0  0 - 5 %   Eosinophils Absolute 0.0  0.0 - 0.7 K/uL   Basophils Relative 0  0 - 1 %   Basophils Absolute 0.0  0.0 - 0.1 K/uL   COMPREHENSIVE METABOLIC PANEL      Result Value Range   Sodium 135  135 - 145 mEq/L   Potassium 3.4 (*) 3.5 - 5.1 mEq/L   Chloride 101  96 - 112 mEq/L   CO2 25  19 - 32 mEq/L   Glucose, Bld 88  70 - 99 mg/dL   BUN 10  6 - 23 mg/dL   Creatinine, Ser 7.82  0.50 - 1.10 mg/dL   Calcium 9.3  8.4 - 95.6 mg/dL   Total Protein 9.4 (*) 6.0 - 8.3 g/dL   Albumin 3.9  3.5 - 5.2 g/dL   AST 40 (*) 0 - 37 U/L   ALT 37 (*) 0 - 35 U/L   Alkaline Phosphatase 73  39 - 117 U/L   Total Bilirubin 0.2 (*) 0.3 - 1.2 mg/dL   GFR calc non Af Amer >90  >90 mL/min   GFR calc Af Amer >90  >90 mL/min  LIPASE, BLOOD      Result Value Range   Lipase 55  11 - 59 U/L  URINALYSIS, ROUTINE W REFLEX MICROSCOPIC      Result Value Range   Color, Urine YELLOW  YELLOW   APPearance CLOUDY (*) CLEAR   Specific Gravity, Urine 1.020  1.005 - 1.030   pH 7.0  5.0 - 8.0   Glucose, UA NEGATIVE  NEGATIVE mg/dL   Hgb urine dipstick NEGATIVE  NEGATIVE   Bilirubin Urine NEGATIVE  NEGATIVE   Ketones, ur NEGATIVE  NEGATIVE mg/dL   Protein, ur NEGATIVE  NEGATIVE mg/dL   Urobilinogen, UA 0.2  0.0 - 1.0 mg/dL   Nitrite NEGATIVE  NEGATIVE   Leukocytes, UA MODERATE (*) NEGATIVE  URINE MICROSCOPIC-ADD ON      Result Value Range   Squamous Epithelial / LPF FEW (*) RARE   WBC, UA 7-10  <3 WBC/hpf   Urine-Other MUCOUS PRESENT    POCT PREGNANCY, URINE      Result Value Range   Preg Test, Ur NEGATIVE  NEGATIVE         MDM   1. Trichomonas   2. STD (female)        Patient seen and evaluated. Patient rocking in bed appears uncomfortable. She does not appear severely ill or toxic.   Patient with unremarkable CT scan on July 29. She also reports having an outpatient ultrasound 2 weeks ago without any concerning dominant findings. She does have very mildly elevated AST and ALT. No changes with alk phos or bilirubin.  Angus Seller, PA-C 09/12/12 [phone]

## 2012-09-12 NOTE — ED Provider Notes (Signed)
Medical screening examination/treatment/procedure(s) were performed by non-physician practitioner and as supervising physician I was immediately available for consultation/collaboration.  Lekita Kerekes M Quintina Hakeem, MD 09/12/12 0619 
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
## 2012-09-13 LAB — GC/CHLAMYDIA PROBE AMP: CT Probe RNA: POSITIVE — AB

## 2012-09-15 NOTE — ED Notes (Signed)
+   Chlamydia + gonorrhea Patient treated with Rocephin And Zithromax-[facility] faxed 

## 2012-09-16 ENCOUNTER — Telehealth (HOSPITAL_COMMUNITY): Payer: Self-pay | Admitting: Emergency Medicine

## 2012-09-17 ENCOUNTER — Telehealth (HOSPITAL_COMMUNITY): Payer: Self-pay | Admitting: Emergency Medicine

## 2012-09-17 NOTE — ED Notes (Signed)
Unable to contact patient via phone. Sent letter. °

## 2013-06-04 ENCOUNTER — Emergency Department (HOSPITAL_COMMUNITY)
Admission: EM | Admit: 2013-06-04 | Discharge: 2013-06-04 | Disposition: A | Discharge status: Home / Self Care | Payer: BC Managed Care – PPO | Attending: Emergency Medicine | Admitting: Emergency Medicine

## 2013-06-04 ENCOUNTER — Emergency Department (HOSPITAL_COMMUNITY): Payer: BC Managed Care – PPO

## 2013-06-04 ENCOUNTER — Encounter (HOSPITAL_COMMUNITY): Payer: Self-pay | Admitting: Emergency Medicine

## 2013-06-04 DIAGNOSIS — S298XXA Other specified injuries of thorax, initial encounter: Secondary | ICD-10-CM | POA: Insufficient documentation

## 2013-06-04 DIAGNOSIS — F17200 Nicotine dependence, unspecified, uncomplicated: Secondary | ICD-10-CM | POA: Insufficient documentation

## 2013-06-04 DIAGNOSIS — S1093XA Contusion of unspecified part of neck, initial encounter: Secondary | ICD-10-CM

## 2013-06-04 DIAGNOSIS — S0083XA Contusion of other part of head, initial encounter: Secondary | ICD-10-CM | POA: Insufficient documentation

## 2013-06-04 DIAGNOSIS — S0003XA Contusion of scalp, initial encounter: Secondary | ICD-10-CM | POA: Insufficient documentation

## 2013-06-04 DIAGNOSIS — Z8719 Personal history of other diseases of the digestive system: Secondary | ICD-10-CM | POA: Insufficient documentation

## 2013-06-04 DIAGNOSIS — IMO0002 Reserved for concepts with insufficient information to code with codable children: Secondary | ICD-10-CM | POA: Insufficient documentation

## 2013-06-04 DIAGNOSIS — Z8742 Personal history of other diseases of the female genital tract: Secondary | ICD-10-CM | POA: Insufficient documentation

## 2013-06-04 DIAGNOSIS — S139XXA Sprain of joints and ligaments of unspecified parts of neck, initial encounter: Secondary | ICD-10-CM

## 2013-06-04 HISTORY — DX: Acute parametritis and pelvic cellulitis: N73.0

## 2013-06-04 MED ORDER — NAPROXEN 500 MG PO TABS: 500.0000 mg | ORAL_TABLET | Freq: Two times a day (BID) | ORAL | Status: DC

## 2013-06-04 MED ORDER — TRAMADOL HCL 50 MG PO TABS: 50.0000 mg | ORAL_TABLET | Freq: Four times a day (QID) | ORAL | Status: DC | PRN

## 2013-06-04 NOTE — Discharge Instructions (Signed)
Ice you neck. Naprosyn and ultram for pain. Follow up with your doctor as needed.     Contusion A contusion is a deep bruise. Contusions are the result of an injury that caused bleeding under the skin. The contusion may turn blue, purple, or yellow. Minor injuries will give you a painless contusion, but more severe contusions may stay painful and swollen for a few weeks.  CAUSES  A contusion is usually caused by a blow, trauma, or direct force to an area of the body. SYMPTOMS   Swelling and redness of the injured area.  Bruising of the injured area.  Tenderness and soreness of the injured area.  Pain. DIAGNOSIS  The diagnosis can be made by taking a history and physical exam. An X-ray, CT scan, or MRI may be needed to determine if there were any associated injuries, such as fractures. TREATMENT  Specific treatment will depend on what area of the body was injured. In general, the best treatment for a contusion is resting, icing, elevating, and applying cold compresses to the injured area. Over-the-counter medicines may also be recommended for pain control. Ask your caregiver what the best treatment is for your contusion. HOME CARE INSTRUCTIONS   Put ice on the injured area.  Put ice in a plastic bag.  Place a towel between your skin and the bag.  Leave the ice on for 15-20 minutes, 03-04 times a day.  Only take over-the-counter or prescription medicines for pain, discomfort, or fever as directed by your caregiver. Your caregiver may recommend avoiding anti-inflammatory medicines (aspirin, ibuprofen, and naproxen) for 48 hours because these medicines may increase bruising.  Rest the injured area.  If possible, elevate the injured area to reduce swelling. SEEK IMMEDIATE MEDICAL CARE IF:   You have increased bruising or swelling.  You have pain that is getting worse.  Your swelling or pain is not relieved with medicines. MAKE SURE YOU:   Understand these instructions.  Will  watch your condition.  Will get help right away if you are not doing well or get worse. Document Released: 09/30/2004 Document Revised: 03/15/2011 Document Reviewed: 10/26/2010 [facility] Patient Information 2014 [place], [place].
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
## 2013-06-07 NOTE — ED Provider Notes (Signed)
Medical screening examination/treatment/procedure(s) were performed by non-physician practitioner and as supervising physician I was immediately available for consultation/collaboration.   EKG Interpretation   Date/Time:  Monday June 04 2013 16:19:42 EDT Ventricular Rate:  75 PR Interval:  143 QRS Duration: 74 QT Interval:  371 QTC Calculation: 414 R Axis:   82 Text Interpretation:  Sinus rhythm ED PHYSICIAN INTERPRETATION AVAILABLE  IN [facility] Confirmed by TEST, Record ([id number]) on 06/06/2013 7:30:57  AM       Raeford Razor, MD 06/07/13 [phone]

## 2013-09-07 ENCOUNTER — Encounter (HOSPITAL_COMMUNITY): Payer: Self-pay | Admitting: Emergency Medicine

## 2013-09-07 ENCOUNTER — Emergency Department (HOSPITAL_COMMUNITY): Payer: PRIVATE HEALTH INSURANCE

## 2013-09-07 ENCOUNTER — Emergency Department (HOSPITAL_COMMUNITY)
Admission: EM | Admit: 2013-09-07 | Discharge: 2013-09-07 | Disposition: A | Discharge status: Home / Self Care | Payer: PRIVATE HEALTH INSURANCE | Attending: Emergency Medicine | Admitting: Emergency Medicine

## 2013-09-07 DIAGNOSIS — F17200 Nicotine dependence, unspecified, uncomplicated: Secondary | ICD-10-CM | POA: Diagnosis not present

## 2013-09-07 DIAGNOSIS — Z791 Long term (current) use of non-steroidal anti-inflammatories (NSAID): Secondary | ICD-10-CM | POA: Diagnosis not present

## 2013-09-07 DIAGNOSIS — Z79899 Other long term (current) drug therapy: Secondary | ICD-10-CM | POA: Diagnosis not present

## 2013-09-07 DIAGNOSIS — R109 Unspecified abdominal pain: Secondary | ICD-10-CM | POA: Insufficient documentation

## 2013-09-07 DIAGNOSIS — Z3202 Encounter for pregnancy test, result negative: Secondary | ICD-10-CM | POA: Insufficient documentation

## 2013-09-07 DIAGNOSIS — Z8719 Personal history of other diseases of the digestive system: Secondary | ICD-10-CM | POA: Insufficient documentation

## 2013-09-07 DIAGNOSIS — A5901 Trichomonal vulvovaginitis: Secondary | ICD-10-CM | POA: Insufficient documentation

## 2013-09-07 DIAGNOSIS — R51 Headache: Secondary | ICD-10-CM | POA: Insufficient documentation

## 2013-09-07 DIAGNOSIS — A599 Trichomoniasis, unspecified: Secondary | ICD-10-CM

## 2013-09-07 DIAGNOSIS — N730 Acute parametritis and pelvic cellulitis: Secondary | ICD-10-CM | POA: Insufficient documentation

## 2013-09-07 LAB — CBC WITH DIFFERENTIAL/PLATELET
Basophils Absolute: 0 10*3/uL (ref 0.0–0.1)
Basophils Relative: 0 % (ref 0–1)
EOS ABS: 0.1 10*3/uL (ref 0.0–0.7)
Eosinophils Relative: 1 % (ref 0–5)
HEMATOCRIT: 39.6 % (ref 36.0–46.0)
HEMOGLOBIN: 13.4 g/dL (ref 12.0–15.0)
LYMPHS ABS: 2 10*3/uL (ref 0.7–4.0)
Lymphocytes Relative: 29 % (ref 12–46)
MCH: 28.5 pg (ref 26.0–34.0)
MCHC: 33.8 g/dL (ref 30.0–36.0)
MCV: 84.3 fL (ref 78.0–100.0)
MONO ABS: 0.4 10*3/uL (ref 0.1–1.0)
MONOS PCT: 6 % (ref 3–12)
Neutro Abs: 4.3 10*3/uL (ref 1.7–7.7)
Neutrophils Relative %: 64 % (ref 43–77)
Platelets: 312 10*3/uL (ref 150–400)
RBC: 4.7 MIL/uL (ref 3.87–5.11)
RDW: 13.4 % (ref 11.5–15.5)
WBC: 6.8 10*3/uL (ref 4.0–10.5)

## 2013-09-07 LAB — COMPREHENSIVE METABOLIC PANEL
ALBUMIN: 4 g/dL (ref 3.5–5.2)
ALT: 9 U/L (ref 0–35)
ANION GAP: 12 (ref 5–15)
AST: 16 U/L (ref 0–37)
Alkaline Phosphatase: 55 U/L (ref 39–117)
BILIRUBIN TOTAL: 0.4 mg/dL (ref 0.3–1.2)
BUN: 11 mg/dL (ref 6–23)
CHLORIDE: 105 meq/L (ref 96–112)
CO2: 24 mEq/L (ref 19–32)
CREATININE: 0.76 mg/dL (ref 0.50–1.10)
Calcium: 8.9 mg/dL (ref 8.4–10.5)
GFR calc non Af Amer: >90 mL/min (ref >90)
GLUCOSE: 96 mg/dL (ref 70–99)
Potassium: 3.8 mEq/L (ref 3.7–5.3)
Sodium: 141 mEq/L (ref 137–147)
Total Protein: 7.3 g/dL (ref 6.0–8.3)

## 2013-09-07 LAB — URINALYSIS, ROUTINE W REFLEX MICROSCOPIC
BILIRUBIN URINE: NEGATIVE
Glucose, UA: NEGATIVE mg/dL
HGB URINE DIPSTICK: NEGATIVE
Ketones, ur: 15 mg/dL — AB
Leukocytes, UA: NEGATIVE
Nitrite: NEGATIVE
PROTEIN: NEGATIVE mg/dL
SPECIFIC GRAVITY, URINE: 1.02 (ref 1.005–1.030)
UROBILINOGEN UA: 0.2 mg/dL (ref 0.0–1.0)
pH: 6 (ref 5.0–8.0)

## 2013-09-07 LAB — WET PREP, GENITAL: YEAST WET PREP: NONE SEEN

## 2013-09-07 LAB — POC URINE PREG, ED: PREG TEST UR: NEGATIVE

## 2013-09-07 MED ORDER — IBUPROFEN 800 MG PO TABS: 800.0000 mg | ORAL_TABLET | Freq: Three times a day (TID) | ORAL | Status: DC | PRN

## 2013-09-07 MED ORDER — IBUPROFEN 800 MG PO TABS
800.0000 mg | ORAL_TABLET | Freq: Once | ORAL | Status: AC
  Administered 2013-09-07: 800 mg via ORAL
  Filled 2013-09-07: qty 1

## 2013-09-07 MED ORDER — METRONIDAZOLE 500 MG PO TABS: 500.0000 mg | ORAL_TABLET | Freq: Two times a day (BID) | ORAL | Status: DC

## 2013-09-07 MED ORDER — AZITHROMYCIN 250 MG PO TABS
1000.0000 mg | ORAL_TABLET | Freq: Once | ORAL | Status: AC
  Administered 2013-09-07: 1000 mg via ORAL
  Filled 2013-09-07: qty 4

## 2013-09-07 MED ORDER — HYDROCODONE-ACETAMINOPHEN 5-325 MG PO TABS: 1.0000 | ORAL_TABLET | ORAL | Status: DC | PRN

## 2013-09-07 MED ORDER — ONDANSETRON 4 MG PO TBDP
8.0000 mg | ORAL_TABLET | Freq: Once | ORAL | Status: AC
  Administered 2013-09-07: 8 mg via ORAL
  Filled 2013-09-07: qty 2

## 2013-09-07 MED ORDER — LIDOCAINE HCL (PF) 1 % IJ SOLN
2.0000 mL | Freq: Once | INTRAMUSCULAR | Status: AC
  Administered 2013-09-07: 2 mL
  Filled 2013-09-07: qty 5

## 2013-09-07 MED ORDER — CEFTRIAXONE SODIUM 250 MG IJ SOLR
250.0000 mg | INTRAMUSCULAR | Status: DC
  Administered 2013-09-07: 250 mg via INTRAMUSCULAR
  Filled 2013-09-07: qty 250
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
## 2013-09-07 NOTE — Discharge Instructions (Signed)
Read the information below.  Use the prescribed medication as directed.  Please discuss all new medications with your pharmacist.  Do not take additional tylenol while taking the prescribed pain medication to avoid overdose.  You may return to the Emergency Department at any time for worsening condition or any new symptoms that concern you.  If you develop high fevers, worsening abdominal pain, uncontrolled vomiting, or are unable to tolerate fluids by mouth, return to the ER for a recheck.    Pelvic Inflammatory Disease Pelvic inflammatory disease (PID) refers to an infection in some or all of the female organs. The infection can be in the uterus, ovaries, fallopian tubes, or the surrounding tissues in the pelvis. PID can cause abdominal or pelvic pain that comes on suddenly (acute pelvic pain). PID is a serious infection because it can lead to lasting (chronic) pelvic pain or the inability to have children (infertile).  CAUSES  The infection is often caused by the normal bacteria found in the vaginal tissues. PID may also be caused by an infection that is spread during sexual contact. PID can also occur following:   The birth of a baby.   A miscarriage.   An abortion.   Major pelvic surgery.   The use of an intrauterine device (IUD).   A sexual assault.  RISK FACTORS Certain factors can put a person at higher risk for PID, such as:  Being younger than 25 years.  Being sexually active at [place] age.  Usingnonbarrier contraception.  Havingmultiple sexual partners.  Having sex with someone who has symptoms of a genital infection.  Using oral contraception. Other times, certain behaviors can increase the possibility of getting PID, such as:  Having sex during your period.  Using a vaginal douche.  Having an intrauterine device (IUD) in place. SYMPTOMS   Abdominal or pelvic pain.   Fever.   Chills.   Abnormal vaginal discharge.  Abnormal uterine bleeding.    Unusual pain shortly after finishing your period. DIAGNOSIS  Your caregiver will choose some of the following methods to make a diagnosis, such as:   Performinga physical exam and history. A pelvic exam typically reveals a very tender uterus and surrounding pelvis.   Ordering laboratory tests including a pregnancy test, blood tests, and urine test.  Orderingcultures of the vagina and cervix to check for a sexually transmitted infection (STI).  Performing an ultrasound.   Performing a laparoscopic procedure to look inside the pelvis.  TREATMENT   Antibiotic medicines may be prescribed and taken by mouth.   Sexual partners may be treated when the infection is caused by a sexually transmitted disease (STD).   Hospitalization may be needed to give antibiotics intravenously.  Surgery may be needed, but this is rare. It may take weeks until you are completely well. If you are diagnosed with PID, you should also be checked for human immunodeficiency virus (HIV). HOME CARE INSTRUCTIONS   If given, take your antibiotics as directed. Finish the medicine even if you start to feel better.   Only take over-the-counter or prescription medicines for pain, discomfort, or fever as directed by your caregiver.   Do not have sexual intercourse until treatment is completed or as directed by your caregiver. If PID is confirmed, your recent sexual partner(s) will need treatment.   Keep your follow-up appointments. SEEK MEDICAL CARE IF:   You have increased or abnormal vaginal discharge.   You need prescription medicine for your pain.   You vomit.   You  cannot take your medicines.   Your partner has an STD.  SEEK IMMEDIATE MEDICAL CARE IF:   You have a fever.   You have increased abdominal or pelvic pain.   You have chills.   You have pain when you urinate.   You are not better after 72 hours following treatment.  MAKE SURE YOU:   Understand these  instructions.  Will watch your condition.  Will get help right away if you are not doing well or get worse. Document Released: 12/21/2004 Document Revised: 04/17/2012 Document Reviewed: 12/17/2010 [facility] Patient Information 2015 [place], [place]. This information is not intended to replace advice given to you by your health care provider. Make sure you discuss any questions you have with your health care provider.  Sexually Transmitted Disease A sexually transmitted disease (STD) is a disease or infection that may be passed (transmitted) from person to person, usually during sexual activity. This may happen by way of saliva, semen, blood, vaginal mucus, or urine. Common STDs include:   Gonorrhea.   Chlamydia.   Syphilis.   HIV and AIDS.   Genital herpes.   Hepatitis B and C.   Trichomonas.   Human papillomavirus (HPV).   Pubic lice.   Scabies.  Mites.  Bacterial vaginosis. WHAT ARE CAUSES OF STDs? An STD may be caused by bacteria, a virus, or parasites. STDs are often transmitted during sexual activity if one person is infected. However, they may also be transmitted through nonsexual means. STDs may be transmitted after:   Sexual intercourse with an infected person.   Sharing sex toys with an infected person.   Sharing needles with an infected person or using unclean piercing or tattoo needles.  Having intimate contact with the genitals, mouth, or rectal areas of an infected person.   Exposure to infected fluids during birth. WHAT ARE THE SIGNS AND SYMPTOMS OF STDs? Different STDs have different symptoms. Some people may not have any symptoms. If symptoms are present, they may include:   Painful or bloody urination.   Pain in the pelvis, abdomen, vagina, anus, throat, or eyes.   A skin rash, itching, or irritation.  Growths, ulcerations, blisters, or sores in the genital and anal areas.  Abnormal vaginal discharge with or without bad odor.    Penile discharge in men.   Fever.   Pain or bleeding during sexual intercourse.   Swollen glands in the groin area.   Yellow skin and eyes (jaundice). This is seen with hepatitis.   Swollen testicles.  Infertility.  Sores and blisters in the mouth. HOW ARE STDs DIAGNOSED? To make a diagnosis, your health care provider may:   Take a medical history.   Perform a physical exam.   Take a sample of any discharge to examine.  Swab the throat, cervix, opening to the penis, rectum, or vagina for testing.  Test a sample of your first morning urine.   Perform blood tests.   Perform a Pap test, if this applies.   Perform a colposcopy.   Perform a laparoscopy.  HOW ARE STDs TREATED? Treatment depends on the STD. Some STDs may be treated but not cured.   Chlamydia, gonorrhea, trichomonas, and syphilis can be cured with antibiotic medicine.   Genital herpes, hepatitis, and HIV can be treated, but not cured, with prescribed medicines. The medicines lessen symptoms.   Genital warts from HPV can be treated with medicine or by freezing, burning (electrocautery), or surgery. Warts may come back.   HPV cannot be cured  with medicine or surgery. However, abnormal areas may be removed from the cervix, vagina, or vulva.   If your diagnosis is confirmed, your recent sexual partners need treatment. This is true even if they are symptom-free or have a negative culture or evaluation. They should not have sex until their health care providers say it is okay. HOW CAN I REDUCE MY RISK OF GETTING AN STD? Take these steps to reduce your risk of getting an STD:  Use latex condoms, dental dams, and water-soluble lubricants during sexual activity. Do not use petroleum jelly or oils.  Avoid having multiple sex partners.  Do not have sex with someone who has other sex partners.  Do not have sex with anyone you do not know or who is at high risk for an STD.  Avoid risky sex  practices that can break your skin.  Do not have sex if you have open sores on your mouth or skin.  Avoid drinking too much alcohol or taking illegal drugs. Alcohol and drugs can affect your judgment and put you in a vulnerable position.  Avoid engaging in oral and anal sex acts.  Get vaccinated for HPV and hepatitis. If you have not received these vaccines in the past, talk to your health care provider about whether one or both might be right for you.   If you are at risk of being infected with HIV, it is recommended that you take a prescription medicine daily to prevent HIV infection. This is called pre-exposure prophylaxis (PrEP). You are considered at risk if:  You are a man who has sex with other men (MSM).  You are a heterosexual man or woman and are sexually active with more than one partner.  You take drugs by injection.  You are sexually active with a partner who has HIV.  Talk with your health care provider about whether you are at high risk of being infected with HIV. If you choose to begin PrEP, you should first be tested for HIV. You should then be tested every 3 months for as long as you are taking PrEP.  WHAT SHOULD I DO IF I THINK I HAVE AN STD?  See your health care provider.   Tell your sexual partner(s). They should be tested and treated for any STDs.  Do not have sex until your health care provider says it is okay. WHEN SHOULD I GET IMMEDIATE MEDICAL CARE? Contact your health care provider right away if:   You have severe abdominal pain.  You are a man and notice swelling or pain in your testicles.  You are a woman and notice swelling or pain in your vagina. Document Released: 03/13/2002 Document Revised: 12/26/2012 Document Reviewed: 07/11/2012 [facility] Patient Information 2015 [place], [place]. This information is not intended to replace advice given to you by your health care provider. Make sure you discuss any questions you have with your health care  provider.

## 2013-09-07 NOTE — ED Notes (Signed)
[place] called and stated patient was able to be taken.

## 2013-09-07 NOTE — ED Notes (Signed)
Pt with multiple complaints.  Posterior headache after she comes home from work that is better in the am.  Pt also c/o lower abdominal pain and "fluttering" as well as pain on urination.  States it does not feel like the last time she had PID.

## 2013-09-07 NOTE — ED Notes (Signed)
Patient not in room. Patient's stuff still in room.

## 2013-09-07 NOTE — ED Notes (Signed)
PA at the bedside.

## 2013-09-07 NOTE — ED Notes (Signed)
Patient had not reaction to antibiotics. Patient Stable.

## 2013-09-07 NOTE — ED Notes (Signed)
Patient has not returned from [place]. Family in the room.

## 2013-09-07 NOTE — ED Provider Notes (Signed)
CSN: [id number]     Arrival date & time 09/07/13  1143 History   First MD Initiated Contact with Patient 09/07/13 1343     Chief Complaint  Patient presents with  . Headache  . Abdominal Pain     (Consider location/radiation/quality/duration/timing/severity/associated sxs/prior Treatment) The history is provided by the patient.    Patient presents with intermittent lower abdominal pain x 2-3 weeks that has been gradually worsening.  Located suprapubic area, no radiation, intermittent, occasionally light, occasionally stabbing, comes and goes.  Associated urinary urgency, dysuria, increased vaginal discharge.  Has also had throbbing headaches involving her entire head, relieved with tylenol and rest.  Has had N/V x 1.  Denies fevers, change in bowel habits.    Past Medical History  Diagnosis Date  . Constipation   . PID (acute pelvic inflammatory disease)    History reviewed. No pertinent past surgical history. No family history on file. History  Substance Use Topics  . Smoking status: Current Every Day Smoker -- 0.25 packs/day    Types: Cigarettes  . Smokeless tobacco: Not on file  . Alcohol Use: Yes   OB History   Grav Para Term Preterm Abortions TAB SAB Ect Mult Living                 Review of Systems  All other systems reviewed and are negative.     Allergies  Review of patient's allergies indicates no known allergies.  Home Medications   Prior to Admission medications   Medication Sig Start Date End Date Taking? Authorizing Provider  dicyclomine (BENTYL) 10 MG capsule Take 10 mg by mouth. 08/03/10  Yes Historical Provider, MD  naproxen (NAPROSYN) 500 MG tablet Take 1 tablet (500 mg total) by mouth 2 (two) times daily. 06/04/13   Tatyana A Kirichenko, PA-C  traMADol (ULTRAM) 50 MG tablet Take 1 tablet (50 mg total) by mouth every 6 (six) hours as needed. 06/04/13   Tatyana A Kirichenko, PA-C   BP 122/80  Pulse 74  Temp(Src) 98.4 F (36.9 C) (Oral)  Resp 16  Ht   (1.651 m)  Wt 137 lb (62.143 kg)  BMI 22.80 kg/m2  SpO2 97%  LMP 08/30/2013 Physical Exam  Nursing note and vitals reviewed. Constitutional: She appears well-developed and well-nourished. No distress.  HENT:  Head: Normocephalic and atraumatic.  Neck: Neck supple.  Cardiovascular: Normal rate and regular rhythm.   Pulmonary/Chest: Effort normal and breath sounds normal. No respiratory distress. She has no wheezes. She has no rales.  Abdominal: Soft. She exhibits no distension. There is tenderness in the suprapubic area. There is no rebound and no guarding.  Genitourinary: Uterus is tender. Cervix exhibits discharge. Right adnexum displays tenderness. Left adnexum displays no mass, no tenderness and no fullness. No erythema, tenderness or bleeding around the vagina. No foreign body around the vagina. No signs of injury around the vagina. Vaginal discharge found.  Mild right adnexal tenderness.  No cervical motion tenderness.  Moderate amount of white discharge in vaginal and yellow discharge coming through cervical os.   Neurological: She is alert.  Skin: She is not diaphoretic.    ED Course  Procedures (including critical care time) Labs Review Labs Reviewed  WET PREP, GENITAL - Abnormal; Notable for the following:    Trich, Wet Prep MANY (*)    Clue Cells Wet Prep HPF POC FEW (*)    WBC, Wet Prep HPF POC MODERATE (*)    All other components within normal limits  URINALYSIS, ROUTINE W REFLEX MICROSCOPIC - Abnormal; Notable for the following:    Color, Urine AMBER (*)    APPearance CLOUDY (*)    Ketones, ur 15 (*)    All other components within normal limits  GC/CHLAMYDIA PROBE AMP  CBC WITH DIFFERENTIAL  COMPREHENSIVE METABOLIC PANEL  RPR  HIV ANTIBODY (ROUTINE TESTING)  POC URINE PREG, ED    Imaging Review No results found.   EKG Interpretation None      MDM   Final diagnoses:  PID (acute pelvic inflammatory disease)  Trichomonas vaginalis infection     Afebrile, nontoxic patient with abnormal vaginal discharge, urinary symptoms, suprapubic pain.  Wet prep positive for trich.  Clinically concern for PID. Pt agrees to treatment for both.    At change of shift pending Pelvic US to r/o TOA as pt had mild right adnexal tenderness.   Anticipate  D/C home with flagyl after treatment here with rocephin and azithromycin.   Discussed result, findings, treatment, and follow up  with patient.  Pt given return precautions.  Pt verbalizes understanding and agrees with plan.      5:05 PM Discussed with Felicie Morn, NP, who assumed care of patient pending [place] at change of shift.      Trixie Dredge, PA-C 09/07/13 1705

## 2013-09-07 NOTE — ED Notes (Signed)
Patient returned from US.

## 2013-09-08 LAB — RPR

## 2013-09-08 LAB — HIV ANTIBODY (ROUTINE TESTING W REFLEX): HIV 1&2 Ab, 4th Generation: NONREACTIVE

## 2013-09-08 LAB — GC/CHLAMYDIA PROBE AMP
CT Probe RNA: POSITIVE — AB
GC Probe RNA: NEGATIVE

## 2013-09-08 NOTE — ED Provider Notes (Signed)
Medical screening examination/treatment/procedure(s) were performed by non-physician practitioner and as supervising physician I was immediately available for consultation/collaboration.   EKG Interpretation None        Audree Camel, MD 09/08/13 [phone]

## 2013-09-09 ENCOUNTER — Telehealth (HOSPITAL_BASED_OUTPATIENT_CLINIC_OR_DEPARTMENT_OTHER): Payer: Self-pay | Admitting: Emergency Medicine

## 2013-09-09 NOTE — Telephone Encounter (Signed)
+  Chlamydia. Patient treated with Zithromax. [facility] faxed. 

## 2013-09-11 NOTE — ED Provider Notes (Signed)
Medical screening examination/treatment/procedure(s) were performed by non-physician practitioner and as supervising physician I was immediately available for consultation/collaboration.  Ada [place] L Azir Muzyka, MD 09/11/13 0945 

## 2014-08-15 ENCOUNTER — Emergency Department (HOSPITAL_COMMUNITY)
Admission: EM | Admit: 2014-08-15 | Discharge: 2014-08-15 | Disposition: A | Discharge status: Home / Self Care | Payer: PRIVATE HEALTH INSURANCE | Attending: Emergency Medicine | Admitting: Emergency Medicine

## 2014-08-15 DIAGNOSIS — N938 Other specified abnormal uterine and vaginal bleeding: Secondary | ICD-10-CM | POA: Insufficient documentation

## 2014-08-15 DIAGNOSIS — Z8719 Personal history of other diseases of the digestive system: Secondary | ICD-10-CM | POA: Insufficient documentation

## 2014-08-15 DIAGNOSIS — Z79899 Other long term (current) drug therapy: Secondary | ICD-10-CM | POA: Insufficient documentation

## 2014-08-15 DIAGNOSIS — Z720 Tobacco use: Secondary | ICD-10-CM | POA: Insufficient documentation

## 2014-08-15 DIAGNOSIS — Z3202 Encounter for pregnancy test, result negative: Secondary | ICD-10-CM | POA: Insufficient documentation

## 2014-08-15 LAB — WET PREP, GENITAL
Clue Cells Wet Prep HPF POC: NONE SEEN
Trich, Wet Prep: NONE SEEN
Yeast Wet Prep HPF POC: NONE SEEN

## 2014-08-15 LAB — POC URINE PREG, ED: Preg Test, Ur: NEGATIVE

## 2014-08-15 LAB — GC/CHLAMYDIA PROBE AMP (~~LOC~~) NOT AT ARMC
CHLAMYDIA, DNA PROBE: POSITIVE — AB
NEISSERIA GONORRHEA: NEGATIVE

## 2014-08-15 LAB — HIV ANTIBODY (ROUTINE TESTING W REFLEX): HIV Screen 4th Generation wRfx: NONREACTIVE

## 2014-08-15 MED ORDER — IBUPROFEN 600 MG PO TABS: 600.0000 mg | ORAL_TABLET | Freq: Four times a day (QID) | ORAL | Status: DC | PRN

## 2014-08-15 NOTE — Discharge Instructions (Signed)
Please maintain pelvic rest. Please see the Gynecologist as requested in 1 week if the bleeding persists. Return to the ER if the symptoms get worse.   Abnormal Uterine Bleeding Abnormal uterine bleeding can affect women at various stages in life, including teenagers, women in their reproductive years, pregnant women, and women who have reached menopause. Several kinds of uterine bleeding are considered abnormal, including:  Bleeding or spotting between periods.   Bleeding after sexual intercourse.   Bleeding that is heavier or more than normal.   Periods that last longer than usual.  Bleeding after menopause.  Many cases of abnormal uterine bleeding are minor and simple to treat, while others are more serious. Any type of abnormal bleeding should be evaluated by your health care provider. Treatment will depend on the cause of the bleeding. HOME CARE INSTRUCTIONS Monitor your condition for any changes. The following actions may help to alleviate any discomfort you are experiencing:  Avoid the use of tampons and douches as directed by your health care provider.  Change your pads frequently. You should get regular pelvic exams and Pap tests. Keep all follow-up appointments for diagnostic tests as directed by your health care provider.  SEEK MEDICAL CARE IF:   Your bleeding lasts more than 1 week.   You feel dizzy at times.  SEEK IMMEDIATE MEDICAL CARE IF:   You pass out.   You are changing pads every 15 to 30 minutes.   You have abdominal pain.  You have a fever.   You become sweaty or weak.   You are passing large blood clots from the vagina.   You start to feel nauseous and vomit. MAKE SURE YOU:   Understand these instructions.  Will watch your condition.  Will get help right away if you are not doing well or get worse. Document Released: 12/21/2004 Document Revised: 12/26/2012 Document Reviewed: 07/20/2012 [facility] Patient Information 2015 [place],  [place]. This information is not intended to replace advice given to you by your health care provider. Make sure you discuss any questions you have with your health care provider.

## 2014-08-15 NOTE — ED Notes (Signed)
Pelvic cart is at the bedside 

## 2014-08-15 NOTE — ED Provider Notes (Signed)
CSN: [id number]     Arrival date & time 08/15/14  0450 History   First MD Initiated Contact with Patient 08/15/14 [phone]     No chief complaint on file.    (Consider location/radiation/quality/duration/timing/severity/associated sxs/prior Treatment) HPI Comments: Pt comes in with vaginal bleeding. Pt reports that she started having vaginal bleeding 2 days ago. She had intercourse on Friday. She denies the intercourse related trauma. Pt has no n/v/f/c. She has crampy pain in the suprapubic region, around the pubic rami. There is no uti like sx. Pt doesn't think this is a period related pain or bleeding.   The history is provided by the patient.    Past Medical History  Diagnosis Date  . Constipation   . PID (acute pelvic inflammatory disease)    No past surgical history on file. No family history on file. Social History  Substance Use Topics  . Smoking status: Current Every Day Smoker -- 0.25 packs/day    Types: Cigarettes  . Smokeless tobacco: Not on file  . Alcohol Use: Yes   OB History    No data available     Review of Systems  Constitutional: Positive for activity change.  Respiratory: Negative for shortness of breath.   Cardiovascular: Negative for chest pain.  Gastrointestinal: Negative for nausea, vomiting and abdominal pain.  Genitourinary: Positive for vaginal bleeding and pelvic pain. Negative for dysuria, frequency and vaginal discharge.  Musculoskeletal: Negative for neck pain.  Neurological: Negative for headaches.      Allergies  Review of patient's allergies indicates no known allergies.  Home Medications   Prior to Admission medications   Medication Sig Start Date End Date Taking? Authorizing Provider  acetaminophen (TYLENOL) 500 MG tablet Take 1,500 mg by mouth every 8 (eight) hours as needed for moderate pain.    Historical Provider, MD  HYDROcodone-acetaminophen (NORCO/VICODIN) 5-325 MG per tablet Take 1 tablet by mouth every 4 (four) hours as  needed for moderate pain or severe pain. 09/07/13   Trixie Dredge, PA-C  ibuprofen (ADVIL,MOTRIN) 600 MG tablet Take 1 tablet (600 mg total) by mouth every 6 (six) hours as needed. 08/15/14   Derwood Kaplan, MD  metroNIDAZOLE (FLAGYL) 500 MG tablet Take 1 tablet (500 mg total) by mouth 2 (two) times daily. 09/07/13   Trixie Dredge, PA-C   BP 117/74 mmHg  Pulse 75  Temp(Src) 98.1 F (36.7 C) (Oral)  Resp 18  Ht 5\' 4"  (1.626 m)  Wt 140 lb (63.504 kg)  BMI 24.02 kg/m2  SpO2 100%  LMP 07/20/2014 (Exact Date) Physical Exam  Constitutional: She is oriented to person, place, and time. She appears well-developed.  HENT:  Head: Normocephalic and atraumatic.  Eyes: Conjunctivae and EOM are normal. Pupils are equal, round, and reactive to light.  Neck: Normal range of motion. Neck supple.  Cardiovascular: Normal rate, regular rhythm, normal heart sounds and intact distal pulses.   No murmur heard. Pulmonary/Chest: Effort normal. No respiratory distress. She has no wheezes.  Abdominal: Soft. Bowel sounds are normal. She exhibits no distension. There is no tenderness. There is no rebound and no guarding.  Genitourinary: Vagina normal and uterus normal.  External exam - normal, no lesions Speculum exam: Pt has no discharge. + trace blood - no laceration in the vaginal vault. Bimanual exam: Patient has no CMT, no adnexal tenderness or fullness and cervical os is closed  Neurological: She is alert and oriented to person, place, and time.  Skin: Skin is warm and dry.  Nursing note  and vitals reviewed.   ED Course  Procedures (including critical care time) Labs Review Labs Reviewed  WET PREP, GENITAL - Abnormal; Notable for the following:    WBC, Wet Prep HPF POC FEW (*)    All other components within normal limits  HIV ANTIBODY (ROUTINE TESTING)  POC URINE PREG, ED  GC/CHLAMYDIA PROBE AMP ([place]) NOT AT [facility]    Imaging Review No results found.   EKG Interpretation None      MDM    Final diagnoses:  DUB (dysfunctional uterine bleeding)    Pt with vaginal bleeding and pelvic pain. Mild bleeding. Pelvic exam is benign. The Upreg is neg. No indication for emergent US imaging. Will request gyne f/u.  Derwood Kaplan, MD 08/15/14 [phone]

## 2014-08-16 ENCOUNTER — Telehealth (HOSPITAL_COMMUNITY): Payer: Self-pay

## 2014-08-16 NOTE — Telephone Encounter (Signed)
Results received from [place].  (+) Chlamydia  No antibiotic treatment or prescription given for STD.  Chart to MD office for review.  [facility] form attached. 

## 2014-08-18 ENCOUNTER — Telehealth (HOSPITAL_BASED_OUTPATIENT_CLINIC_OR_DEPARTMENT_OTHER): Payer: Self-pay | Admitting: Emergency Medicine

## 2014-08-18 NOTE — Telephone Encounter (Signed)
Post ED Visit - Positive Culture Follow-up: Successful Patient Follow-Up  Positive Chlamydia culture   Patient discharged without antimicrobial prescription and treatment is now indicated  Organism is resistant to prescribed ED discharge antimicrobial  Patient with positive blood cultures  Changes discussed with ED provider: Mirian Mo, MD New antibiotic prescription:  Azithromycin one gram PO x once Called to [facility] [phone]  Contacted patient, date 08/17/17, time 1600 pt notified of positive Chlamydia and need for treatment. STD instructions provided, patient verbalized understanding. RX called to [facility].   Catherine Meyer 08/18/2014, 4:32 PM

## 2014-09-18 ENCOUNTER — Emergency Department (HOSPITAL_COMMUNITY)
Admission: EM | Admit: 2014-09-18 | Discharge: 2014-09-18 | Disposition: A | Discharge status: Home / Self Care | Payer: BLUE CROSS/BLUE SHIELD | Attending: Emergency Medicine | Admitting: Emergency Medicine

## 2014-09-18 ENCOUNTER — Encounter (HOSPITAL_COMMUNITY): Payer: Self-pay | Admitting: Emergency Medicine

## 2014-09-18 ENCOUNTER — Emergency Department (HOSPITAL_COMMUNITY): Payer: BLUE CROSS/BLUE SHIELD

## 2014-09-18 DIAGNOSIS — J069 Acute upper respiratory infection, unspecified: Secondary | ICD-10-CM | POA: Diagnosis not present

## 2014-09-18 DIAGNOSIS — H9209 Otalgia, unspecified ear: Secondary | ICD-10-CM | POA: Diagnosis not present

## 2014-09-18 DIAGNOSIS — Z720 Tobacco use: Secondary | ICD-10-CM | POA: Insufficient documentation

## 2014-09-18 DIAGNOSIS — Z8719 Personal history of other diseases of the digestive system: Secondary | ICD-10-CM | POA: Diagnosis not present

## 2014-09-18 DIAGNOSIS — Z8742 Personal history of other diseases of the female genital tract: Secondary | ICD-10-CM | POA: Diagnosis not present

## 2014-09-18 DIAGNOSIS — Z792 Long term (current) use of antibiotics: Secondary | ICD-10-CM | POA: Insufficient documentation

## 2014-09-18 DIAGNOSIS — R5383 Other fatigue: Secondary | ICD-10-CM | POA: Diagnosis not present

## 2014-09-18 DIAGNOSIS — R509 Fever, unspecified: Secondary | ICD-10-CM | POA: Diagnosis present

## 2014-09-18 MED ORDER — FLUTICASONE PROPIONATE 50 MCG/ACT NA SUSP: 2.0000 | Freq: Every day | NASAL | Status: DC

## 2014-09-18 MED ORDER — DM-GUAIFENESIN ER 30-600 MG PO TB12: 1.0000 | ORAL_TABLET | Freq: Two times a day (BID) | ORAL | Status: DC

## 2014-09-18 MED ORDER — ALBUTEROL SULFATE HFA 108 (90 BASE) MCG/ACT IN AERS
2.0000 | INHALATION_SPRAY | Freq: Once | RESPIRATORY_TRACT | Status: AC
  Administered 2014-09-18: 2 via RESPIRATORY_TRACT
  Filled 2014-09-18: qty 6.7

## 2014-09-18 NOTE — ED Provider Notes (Signed)
CSN: [id number]     Arrival date & time 09/18/14  1235 History  This chart was scribed for non-physician practitioner, Everlene Farrier, PA-C working with Lavera Guise, MD by Placido Sou, ED scribe. This patient was seen in room WTR9/WTR9 and the patient's care was started at 2:22 PM.   Chief Complaint  Patient presents with  . Cough  . Fever   The history is provided by the patient. No language interpreter was used.    HPI Comments: Catherine Meyer is a 24 y.o. female who presents to the Emergency Department complaining of a constant, moderate, productive cough with onset 10 days ago. Pt notes seeing a provider last week at urgent care and being dx with a URI and prescribed tessalon pearls and allegra which have provided little relief. She notes associated ear pain, post nasal drip, intermittent SOB, chest tightness, mild wheezing, sore throat and subjective fever. 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 Pt denies itchy eyes, n/v/d, urinary symptoms and rashes.   Past Medical History  Diagnosis Date  . Constipation   . PID (acute pelvic inflammatory disease)    History reviewed. No pertinent past surgical history. No family history on file. Social History  Substance Use Topics  . Smoking status: Current Every Day Smoker -- 0.25 packs/day    Types: Cigarettes  . Smokeless tobacco: None  . Alcohol Use: Yes   OB History    No data available     Review of Systems  Constitutional: Positive for fever and fatigue.  HENT: Positive for congestion, ear pain, postnasal drip, rhinorrhea and sneezing. Negative for ear discharge, facial swelling, nosebleeds and trouble swallowing.   Eyes: Negative for itching and visual disturbance.  Respiratory: Positive for cough, chest tightness, shortness of breath and wheezing.   Cardiovascular: Negative for chest pain and palpitations.  Gastrointestinal: Negative for nausea, vomiting and abdominal pain.  Genitourinary: Negative  for dysuria, urgency, frequency, decreased urine volume and difficulty urinating.  Skin: Negative for rash.  Neurological: Negative for dizziness and light-headedness.   Allergies  Review of patient's allergies indicates no known allergies.  Home Medications   Prior to Admission medications   Medication Sig Start Date End Date Taking? Authorizing Provider  acetaminophen (TYLENOL) 500 MG tablet Take 1,500 mg by mouth every 8 (eight) hours as needed for moderate pain.    Historical Provider, MD  dextromethorphan-guaiFENesin (MUCINEX DM) 30-600 MG per 12 hr tablet Take 1 tablet by mouth 2 (two) times daily. 09/18/14   Everlene Farrier, PA-C  fluticasone (FLONASE) 50 MCG/ACT nasal spray Place 2 sprays into both nostrils daily. 09/18/14   Everlene Farrier, PA-C  HYDROcodone-acetaminophen (NORCO/VICODIN) 5-325 MG per tablet Take 1 tablet by mouth every 4 (four) hours as needed for moderate pain or severe pain. 09/07/13   Trixie Dredge, PA-C  ibuprofen (ADVIL,MOTRIN) 600 MG tablet Take 1 tablet (600 mg total) by mouth every 6 (six) hours as needed. 08/15/14   Derwood Kaplan, MD  metroNIDAZOLE (FLAGYL) 500 MG tablet Take 1 tablet (500 mg total) by mouth 2 (two) times daily. 09/07/13   Trixie Dredge, PA-C   BP 131/75 mmHg  Pulse 83  Temp(Src) 98.1 F (36.7 C) (Oral)  Resp 18  SpO2 100%  LMP 09/15/2014 Physical Exam  Constitutional: She is oriented to person, place, and time. She appears well-developed and well-nourished. No distress.  Nontoxic appearing.  HENT:  Head: Normocephalic and atraumatic.  Right Ear: External ear normal.  Left Ear: External  ear normal.  Mouth/Throat: Oropharynx is clear and moist. No oropharyngeal exudate.  Boggy bilateral nasal turbinates; throat clear without erythema or edema. . Bilateral tympanic membranes are pearly-gray without erythema or loss of landmarks.  No sinus tenderness.   Eyes: Conjunctivae are normal. Pupils are equal, round, and reactive to light. Right eye  exhibits no discharge. Left eye exhibits no discharge.  Neck: Normal range of motion. Neck supple. No JVD present. No tracheal deviation present.  Cardiovascular: Normal rate, regular rhythm, normal heart sounds and intact distal pulses.  Exam reveals no gallop and no friction rub.   No murmur heard. Pulmonary/Chest: Effort normal and breath sounds normal. No respiratory distress. She has no wheezes. She has no rales. She exhibits tenderness.  Lungs clear to ascultation bilaterally. No wheezes or rhonchi. Speaking in full sentences. Chest TTP  Abdominal: Soft. There is no tenderness. There is no guarding.  Musculoskeletal: Normal range of motion. She exhibits no edema or tenderness.  LE non-tender without swelling  Lymphadenopathy:    She has no cervical adenopathy.  Neurological: She is alert and oriented to person, place, and time. Coordination normal.  Skin: Skin is warm and dry. No rash noted. She is not diaphoretic. No erythema. No pallor.  Psychiatric: She has a normal mood and affect. Her behavior is normal.  Nursing note and vitals reviewed.  ED Course  Procedures  DIAGNOSTIC STUDIES: Oxygen Saturation is 100% on RA, normal by my interpretation.    COORDINATION OF CARE: 2:30 PM Discussed treatment plan with pt at bedside and pt agreed to plan.  Labs Review Labs Reviewed - No data to display  Imaging Review Dg Chest 2 View  09/18/2014   CLINICAL DATA:  Cough and congestion ; chest tightness for 1 week  EXAM: CHEST  2 VIEW  COMPARISON:  Chest radiograph May 09, 2012; chest CT May 09, 2012  FINDINGS: Lungs are clear. Heart size and pulmonary vascularity are normal. No adenopathy. No pneumothorax. No bone lesions.  IMPRESSION: No abnormality noted.   Electronically Signed   By: Bretta Bang III M.D.   On: 09/18/2014 13:57   I have personally reviewed and evaluated these images and lab results as part of my medical decision-making.   EKG Interpretation None      Filed  Vitals:   09/18/14 1244  BP: 131/75  Pulse: 83  Temp: 98.1 F (36.7 C)  TempSrc: Oral  Resp: 18  SpO2: 100%     MDM   Meds given in ED:  Medications  albuterol (PROVENTIL HFA;VENTOLIN HFA) 108 (90 BASE) MCG/ACT inhaler 2 puff (not administered)    New Prescriptions   DEXTROMETHORPHAN-GUAIFENESIN (MUCINEX DM) 30-600 MG PER 12 HR TABLET    Take 1 tablet by mouth 2 (two) times daily.   FLUTICASONE (FLONASE) 50 MCG/ACT NASAL SPRAY    Place 2 sprays into both nostrils daily.    Final diagnoses:  URI (upper respiratory infection)   This is a 25 year old female who is otherwise healthy presents to the emergency department complaining of a productive cough, subjective fevers, nasal congestion, postnasal drip and ear pressure ongoing for the past 10 days. The patient is diagnosed with an upper respiratory infection by urgent care last week. 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 On exam the patient is afebrile and nontoxic appearing. The patient is not tachypneic, tachycardic or hypoxic. Her lungs are clear to auscultation bilaterally. No wheezes or rhonchi noted. Chest x-ray is unremarkable. Patient provided with  albuterol inhaler in the emergency department. We'll discharge with prescriptions for Mucinex DM and fluticasone nasal spray. I educated on upper respiratory infections and symptomatic treatment of them. Strict return precautions provided. I advised the patient to follow-up with their primary care provider this week. I advised the patient to return to the emergency department with new or worsening symptoms or new concerns. The patient verbalized understanding and agreement with plan.    I personally performed the services described in this documentation, which was scribed in my presence. The recorded information has been reviewed and is accurate.    Everlene Farrier, PA-C 09/18/14 1440  Lavera Guise, MD 09/18/14 2132

## 2014-09-18 NOTE — ED Notes (Signed)
Patient denies pain and is resting comfortably.  

## 2014-09-18 NOTE — Discharge Instructions (Signed)
°Upper Respiratory Infection, Adult °An upper respiratory infection (URI) is also sometimes known as the common cold. The upper respiratory tract includes the nose, sinuses, throat, trachea, and bronchi. Bronchi are the airways leading to the lungs. Most people improve within 1 week, but symptoms can last up to 2 weeks. A residual cough may last even longer.  °CAUSES °Many different viruses can infect the tissues lining the upper respiratory tract. The tissues become irritated and inflamed and often become very moist. Mucus production is also common. A cold is contagious. You can easily spread the virus to others by oral contact. This includes kissing, sharing a glass, coughing, or sneezing. Touching your mouth or nose and then touching a surface, which is then touched by another person, can also spread the virus. °SYMPTOMS  °Symptoms typically develop 1 to 3 days after you come in contact with a cold virus. Symptoms vary from person to person. They may include: °· Runny nose. °· Sneezing. °· Nasal congestion. °· Sinus irritation. °· Sore throat. °· Loss of voice (laryngitis). °· Cough. °· Fatigue. °· Muscle aches. °· Loss of appetite. °· Headache. °· Low-grade fever. °DIAGNOSIS  °You might diagnose your own cold based on familiar symptoms, since most people get a cold 2 to 3 times a year. Your caregiver can confirm this based on your exam. Most importantly, your caregiver can check that your symptoms are not due to another disease such as strep throat, sinusitis, pneumonia, asthma, or epiglottitis. Blood tests, throat tests, and X-rays are not necessary to diagnose a common cold, but they may sometimes be helpful in excluding other more serious diseases. Your caregiver will decide if any further tests are required. °RISKS AND COMPLICATIONS  °You may be at risk for a more severe case of the common cold if you smoke cigarettes, have chronic heart disease (such as heart failure) or lung disease (such as asthma), or  if you have a weakened immune system. The very young and very old are also at risk for more serious infections. Bacterial sinusitis, middle ear infections, and bacterial pneumonia can complicate the common cold. The common cold can worsen asthma and chronic obstructive pulmonary disease (COPD). Sometimes, these complications can require emergency medical care and may be life-threatening. °PREVENTION  °The best way to protect against getting a cold is to practice good hygiene. Avoid oral or hand contact with people with cold symptoms. Wash your hands often if contact occurs. There is no clear evidence that vitamin C, vitamin E, echinacea, or exercise reduces the chance of developing a cold. However, it is always recommended to get plenty of rest and practice good nutrition. °TREATMENT  °Treatment is directed at relieving symptoms. There is no cure. Antibiotics are not effective, because the infection is caused by a virus, not by bacteria. Treatment may include: °· Increased fluid intake. Sports drinks offer valuable electrolytes, sugars, and fluids. °· Breathing heated mist or steam (vaporizer or shower). °· Eating chicken soup or other clear broths, and maintaining good nutrition. °· Getting plenty of rest. °· Using gargles or lozenges for comfort. °· Controlling fevers with ibuprofen or acetaminophen as directed by your caregiver. °· Increasing usage of your inhaler if you have asthma. °Zinc gel and zinc lozenges, taken in the first 24 hours of the common cold, can shorten the duration and lessen the severity of symptoms. Pain medicines may help with fever, muscle aches, and throat pain. A variety of non-prescription medicines are available to treat congestion and runny nose. Your caregiver   can make recommendations and may suggest nasal or lung inhalers for other symptoms.  °HOME CARE INSTRUCTIONS  °· Only take over-the-counter or prescription medicines for pain, discomfort, or fever as directed by your  caregiver. °· Use a warm mist humidifier or inhale steam from a shower to increase air moisture. This may keep secretions moist and make it easier to breathe. °· Drink enough water and fluids to keep your urine clear or pale yellow. °· Rest as needed. °· Return to work when your temperature has returned to normal or as your caregiver advises. You may need to stay home longer to avoid infecting others. You can also use a face mask and careful hand washing to prevent spread of the virus. °SEEK MEDICAL CARE IF:  °· After the first few days, you feel you are getting worse rather than better. °· You need your caregiver's advice about medicines to control symptoms. °· You develop chills, worsening shortness of breath, or brown or red sputum. These may be signs of pneumonia. °· You develop yellow or brown nasal discharge or pain in the face, especially when you bend forward. These may be signs of sinusitis. °· You develop a fever, swollen neck glands, pain with swallowing, or white areas in the back of your throat. These may be signs of strep throat. °SEEK IMMEDIATE MEDICAL CARE IF:  °· You have a fever. °· You develop severe or persistent headache, ear pain, sinus pain, or chest pain. °· You develop wheezing, a prolonged cough, cough up blood, or have a change in your usual mucus (if you have chronic lung disease). °· You develop sore muscles or a stiff neck. °Document Released: 06/16/2000 Document Revised: 03/15/2011 Document Reviewed: 03/28/2013 °ExitCare® Patient Information ©2015 [employer]. This information is not intended to replace advice given to you by your health care provider. Make sure you discuss any questions you have with your health care provider. °Cough, Adult ° A cough is a reflex. It helps you clear your throat and airways. A cough can help heal your body. A cough can last 2 or 3 weeks (acute) or may last more than 8 weeks (chronic). Some common causes of a cough can include an infection, allergy, or  a cold. °HOME CARE °· Only take medicine as told by your doctor. °· If given, take your medicines (antibiotics) as told. Finish them even if you start to feel better. °· Use a cold steam vaporizer or humidifier in your home. This can help loosen thick spit (secretions). °· Sleep so you are almost sitting up (semi-upright). Use pillows to do this. This helps reduce coughing. °· Rest as needed. °· Stop smoking if you smoke. °GET HELP RIGHT AWAY IF: °· You have yellowish-white fluid (pus) in your thick spit. °· Your cough gets worse. °· Your medicine does not reduce coughing, and you are losing sleep. °· You cough up blood. °· You have trouble breathing. °· Your pain gets worse and medicine does not help. °· You have a fever. °MAKE SURE YOU:  °· Understand these instructions. °· Will watch your condition. °· Will get help right away if you are not doing well or get worse. °Document Released: 09/03/2010 Document Revised: 05/07/2013 Document Reviewed: 09/03/2010 °ExitCare® Patient Information ©2015 [employer]. This information is not intended to replace advice given to you by your health care provider. Make sure you discuss any questions you have with your health care provider. ° °

## 2014-09-18 NOTE — ED Notes (Signed)
Pt states one week ago she was seen by PCP for URI and has had no relief with prescriptions. C/o productive cough with dark sputum, fever at home, sore throat. Denies N/V/D.

## 2016-03-01 IMAGING — CR DG CHEST 2V
2 series · 2 of 2 positions shown · non-contrast
Comparison: Chest radiograph May 09, 2012; chest CT May 09, 2012

CLINICAL DATA: Cough and congestion ; chest tightness for 1 week

EXAM:
CHEST  2 VIEW

[w chest pa *]
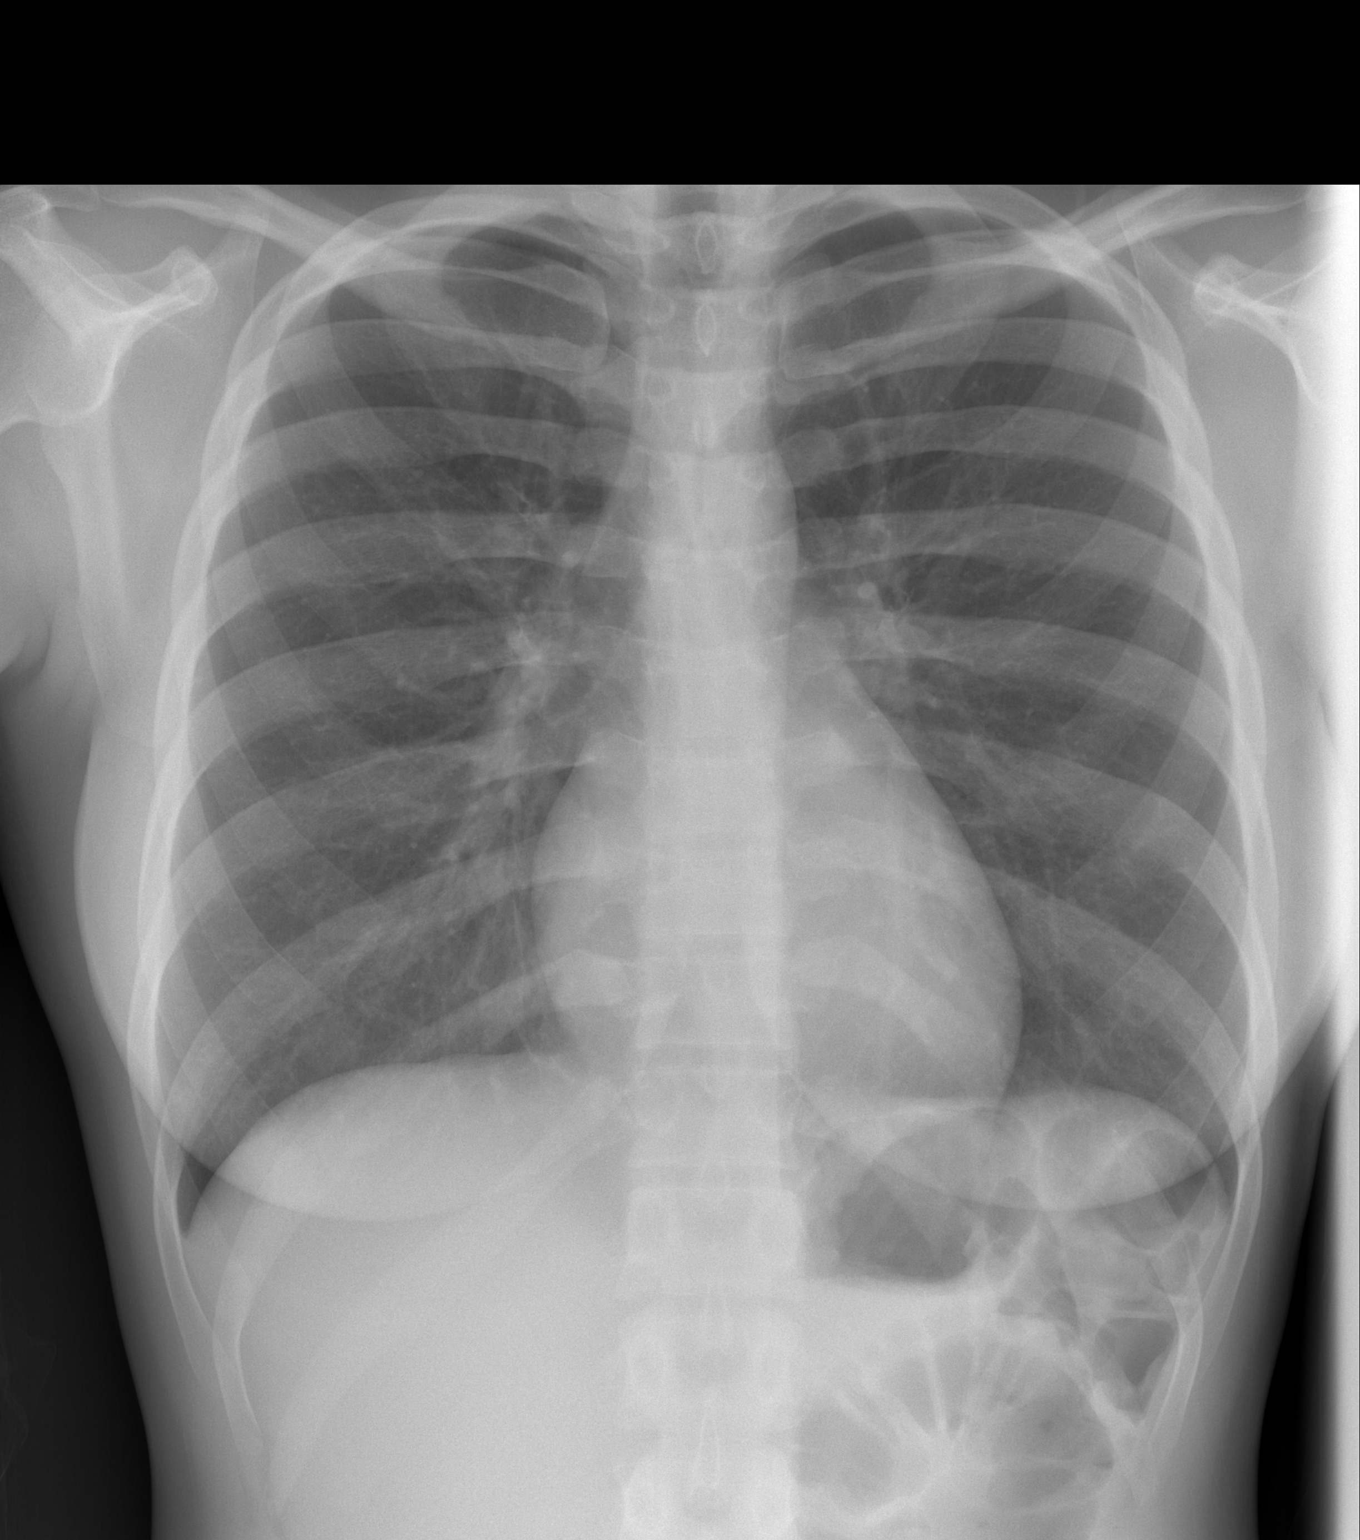

[w chest lat]
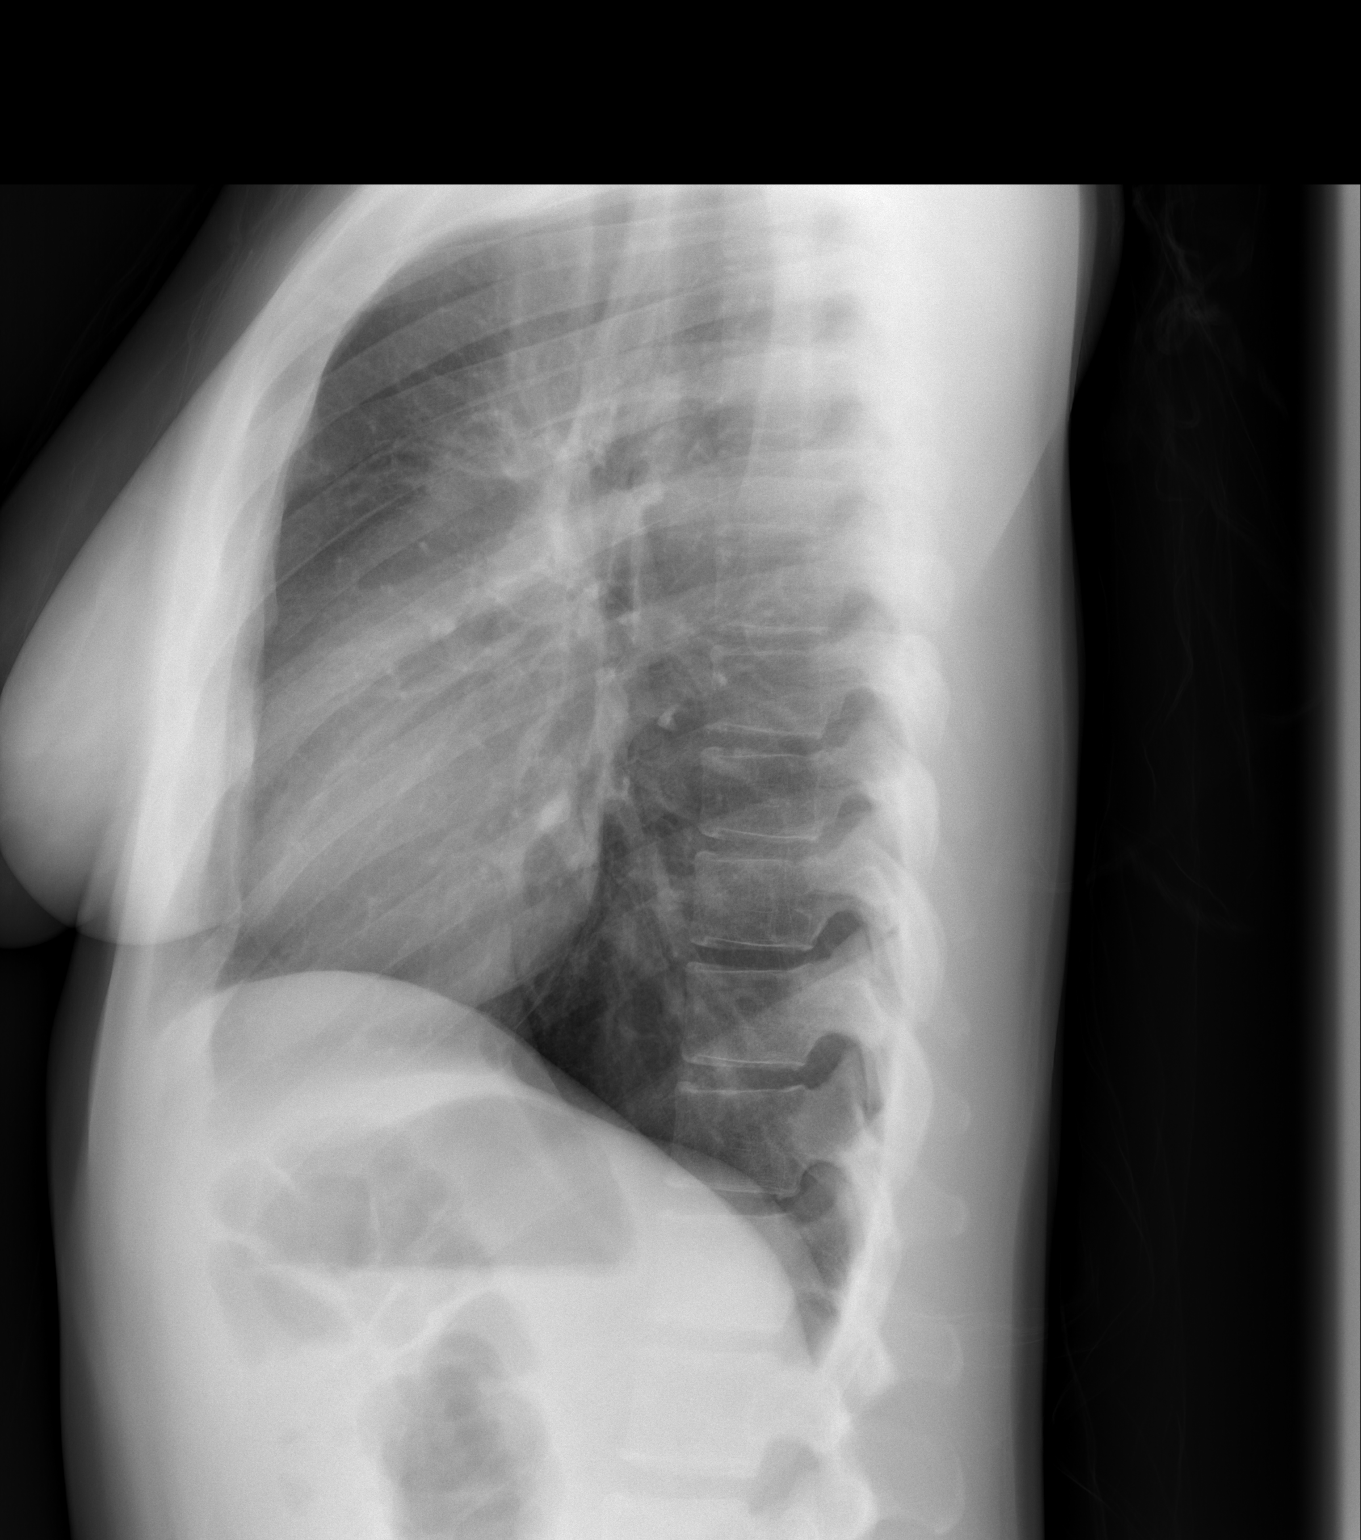

[2 of 2 positions shown; findings below may reference images not displayed]

FINDINGS: Lungs are clear. Heart size and pulmonary vascularity are normal. No
adenopathy. No pneumothorax. No bone lesions.
IMPRESSION: No abnormality noted.

## 2017-01-04 ENCOUNTER — Emergency Department (HOSPITAL_COMMUNITY)
Admission: EM | Admit: 2017-01-04 | Discharge: 2017-01-04 | Disposition: A | Discharge status: Home / Self Care | Payer: BLUE CROSS/BLUE SHIELD | Attending: Emergency Medicine | Admitting: Emergency Medicine

## 2017-01-04 ENCOUNTER — Encounter (HOSPITAL_COMMUNITY): Payer: Self-pay

## 2017-01-04 DIAGNOSIS — K0889 Other specified disorders of teeth and supporting structures: Secondary | ICD-10-CM

## 2017-01-04 DIAGNOSIS — F17210 Nicotine dependence, cigarettes, uncomplicated: Secondary | ICD-10-CM | POA: Insufficient documentation

## 2017-01-04 MED ORDER — AMOXICILLIN 500 MG PO CAPS: 500.0000 mg | ORAL_CAPSULE | Freq: Three times a day (TID) | ORAL | 0 refills | Status: DC

## 2017-01-04 NOTE — ED Provider Notes (Signed)
[facility]-EMERGENCY DEPT Provider Note   CSN: [id number] Arrival date & time: 01/04/17  0140     History   Chief Complaint Chief Complaint  Patient presents with  . Dental Pain    HPI Catherine Meyer is a 27 y.o. female Modena JanskyZentz for evaluation of dental pain that has been ongoing for the past few weeks.  Patient reports that over the last year, she has noticed a chip in the right back molar.  Patient reports that he usually does not cause any issues but states that since the end of November 2018, she has had intermittent right-sided dental pain.  Patient reports that she does not have a dentist that she follows up with.  She has been intermittently taking Tylenol for pain relief.  Patient denies any fevers, difficulty swallowing, difficulty breathing, difficulty eating.  The history is provided by the patient.    Past Medical History:  Diagnosis Date  . Constipation   . PID (acute pelvic inflammatory disease)     There are no active problems to display for this patient.   History reviewed. No pertinent surgical history.  OB History    No data available       Home Medications    Prior to Admission medications   Medication Sig Start Date End Date Taking? Authorizing Provider  acetaminophen (TYLENOL) 500 MG tablet Take 1,500 mg by mouth every 8 (eight) hours as needed for moderate pain.    [provider]  amoxicillin (AMOXIL) 500 MG capsule Take 1 capsule (500 mg total) by mouth 3 (three) times daily. 01/04/17   Maxwell CaulLayden, Onyx Edgley A, PA-C  dextromethorphan-guaiFENesin (MUCINEX DM) 30-600 MG per 12 hr tablet Take 1 tablet by mouth 2 (two) times daily. 09/18/14   Everlene Farrieransie, William, PA-C  fluticasone (FLONASE) 50 MCG/ACT nasal spray Place 2 sprays into both nostrils daily. 09/18/14   Everlene Farrieransie, William, PA-C  HYDROcodone-acetaminophen (NORCO/VICODIN) 5-325 MG per tablet Take 1 tablet by mouth every 4 (four) hours as needed for moderate pain or severe pain.  09/07/13   Trixie DredgeWest, Emily, PA-C  ibuprofen (ADVIL,MOTRIN) 600 MG tablet Take 1 tablet (600 mg total) by mouth every 6 (six) hours as needed. 08/15/14   Derwood KaplanNanavati, Ankit, MD  metroNIDAZOLE (FLAGYL) 500 MG tablet Take 1 tablet (500 mg total) by mouth 2 (two) times daily. 09/07/13   Trixie DredgeWest, Emily, PA-C    Family History History reviewed. No pertinent family history.  Social History Social History   Tobacco Use  . Smoking status: Current Every Day Smoker    Packs/day: 0.25    Types: Cigarettes  . Smokeless tobacco: Never Used  Substance Use Topics  . Alcohol use: Yes  . Drug use: Yes    Types: Marijuana     Allergies   Patient has no known allergies.   Review of Systems Review of Systems  Constitutional: Negative for fever.  HENT: Positive for dental problem. Negative for trouble swallowing.      Physical Exam Updated Vital Signs BP 131/84 (BP Location: Left Arm)   Pulse 94   Temp 98.4 F (36.9 C) (Oral)   Resp 18   LMP 12/09/2016   SpO2 100%   Physical Exam  Constitutional: She appears well-developed and well-nourished.  HENT:  Head: Normocephalic and atraumatic.  Mouth/Throat: Uvula is midline. No trismus in the jaw. Abnormal dentition.    No facial or neck swelling.  Pain, phonation is intact.  No trismus.  Uvula is midline.  Eyes: Conjunctivae and EOM  are normal. Right eye exhibits no discharge. Left eye exhibits no discharge. No scleral icterus.  Pulmonary/Chest: Effort normal.  Neurological: She is alert.  Skin: Skin is warm and dry.  Psychiatric: She has a normal mood and affect. Her speech is normal and behavior is normal.  Nursing note and vitals reviewed.    ED Treatments / Results  Labs (all labs ordered are listed, but only abnormal results are displayed) Labs Reviewed - No data to display  EKG  EKG Interpretation None       Radiology No results found.  Procedures Procedures (including critical care time)  Medications Ordered in  ED Medications - No data to display   Initial Impression / Assessment and Plan / ED Course  I have reviewed the triage vital signs and the nursing notes.  Pertinent labs & imaging results that were available during my care of the patient were reviewed by me and considered in my medical decision making (see chart for details).     27 y.o. yo F presents with a few weeks of dental pain. Patient is afebrile, non-toxic appearing, sitting comfortably on examination table. Vital signs reviewed and stable. No evidence of abscess requiring immediate incision and drainage. Exam not concerning for Ludwig's angina or pharyngeal abscess. Will treat with antibiotic therapy . Patient instructed to follow-up with dentist referral provided. Stable for discharge at this time. Patient had ample opportunity for questions and discussion. All patient's questions were answered with full understanding.Strict return precautions discussed. Patient expresses understanding and agreement to plan.   Final Clinical Impressions(s) / ED Diagnoses   Final diagnoses:  Pain, dental    ED Discharge Orders        Ordered    amoxicillin (AMOXIL) 500 MG capsule  3 times daily     01/04/17 0240       Maxwell Caul, PA-C 01/04/17 1610    Azalia Bilis, MD 01/04/17 [phone]

## 2017-01-04 NOTE — ED Notes (Signed)
Pt complains of a chipped tooth for about one year, right bottom tooth

## 2017-01-04 NOTE — Discharge Instructions (Signed)
Take the medications as directed.  ° °You can take Tylenol or Ibuprofen as directed for pain. You can alternate Tylenol and Ibuprofen every 4 hours. If you take Tylenol at 1pm, then you can take Ibuprofen at 5pm. Then you can take Tylenol again at 9pm.  ° ° °The exam and treatment you received today has been provided on an emergency basis only. This is not a substitute for complete medical or dental care. If your problem worsens or new symptoms (problems) appear, and you are unable to arrange prompt follow-up care with your dentist, call or return to this location. If you do not have a dentist, please follow-up with one on the list provided ° °CALL YOUR DENTIST OR RETURN IMMEDIATELY IF you develop a fever, rash, difficulty breathing or swallowing, neck or facial swelling, or other potentially serious concerns. ° ° °Please follow-up with one of the dental clinics provided to you below or in your paperwork. Call and tell them you were seen in the Emergency Dept and arrange for an appointment. You may have to call multiple places in order to find a place to be seen. ° °Dental Assistance °If the dentist on-call cannot see you, please use the resources below: ° ° °Patients with Medicaid: [facility] °[address], [phone] °[address], [phone] ° °If unable to pay, or uninsured, contact [facility] ([phone]) or [facility] ([phone] in [place], [phone] in [place]) to become qualified for the adult dental clinic ° °Other Low-Cost Community Dental Services: °[facility]- [address], [place], [place], [address] °   [phone], Ext. [phone] °   2nd and 4th Thursday of the month at 6:30am °   10 clients each day by appointment, can sometimes see walk-in     patients if someone does not show for an appointment °[facility]- [address], [place], [place], [address] °   [phone] °[facility]- [address],  [place], [place], [address] °   [phone] ° °[facility]- [phone] °[facility]- [phone] °[facility]- [phone] ° °

## 2017-10-28 ENCOUNTER — Ambulatory Visit: Payer: PRIVATE HEALTH INSURANCE | Admitting: Family Medicine

## 2018-03-17 ENCOUNTER — Encounter: Payer: Self-pay | Admitting: Family Medicine

## 2018-03-17 ENCOUNTER — Other Ambulatory Visit: Payer: Self-pay

## 2018-03-17 ENCOUNTER — Ambulatory Visit: Payer: Managed Care, Other (non HMO) | Admitting: Family Medicine

## 2018-03-17 VITALS — BP 128/88 | Temp 98.6°F | Ht 65.0 in | Wt 176.0 lb

## 2018-03-17 DIAGNOSIS — Z87891 Personal history of nicotine dependence: Secondary | ICD-10-CM | POA: Diagnosis not present

## 2018-03-17 DIAGNOSIS — Z7689 Persons encountering health services in other specified circumstances: Secondary | ICD-10-CM | POA: Diagnosis not present

## 2018-03-17 DIAGNOSIS — H6121 Impacted cerumen, right ear: Secondary | ICD-10-CM | POA: Diagnosis not present

## 2018-03-17 NOTE — Progress Notes (Signed)
Patient presents to clinic today to establish care.  SUBJECTIVE: PMH: Pt is a 28 yo female with no sig pmh.  Pt has not had a pcp in "a while".  Pt endorses recent CPE for work.  Pt notes h/o ear wax buildup.  States typically has decreased hearing from it.    Allergies: nkda  Social hx: Pt is single.  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  Pt endorses social alcohol use.  Pt is a former smoker.  She quit in October by using the patch and cutting down.  Denies drug use.  Health Maintenance: Dental --[facility] [place] Immunizations--influenza vaccine 2019, TB test 2018 PAP -- 2018 LMP--03/12/18  Family medical history: Mom-alive, arthritis, asthma, depression, HLD Dad-alive MGM-alive, alcohol abuse, arthritis, depression, hearing loss, MI, heart disease, stroke MGF-unknown PGM-deceased, diabetes PGF-deceased, alcohol abuse, COPD  Past Medical History:  Diagnosis Date  . Constipation   . PID (acute pelvic inflammatory disease)     History reviewed. No pertinent surgical history.  No current outpatient medications on file prior to visit.   No current facility-administered medications on file prior to visit.     No Known Allergies  History reviewed. No pertinent family history.  Social History   Socioeconomic History  . Marital status: Single    Spouse name: Not on file  . Number of children: Not on file  . Years of education: Not on file  . Highest education level: Not on file  Occupational History  . Not on file  Social Needs  . Financial resource strain: Not on file  . Food insecurity:    Worry: Not on file    Inability: Not on file  . Transportation needs:    Medical: Not on file    Non-medical: Not on file  Tobacco Use  . Smoking status: Current Every Day Smoker    Packs/day: 0.25    Types: Cigarettes  . Smokeless tobacco: Never Used  Substance and Sexual Activity  . Alcohol use: Yes  . Drug use: Yes   Types: Marijuana  . Sexual activity: Yes  Lifestyle  . Physical activity:    Days per week: Not on file    Minutes per session: Not on file  . Stress: Not on file  Relationships  . Social connections:    Talks on phone: Not on file    Gets together: Not on file    Attends religious service: Not on file    Active member of club or organization: Not on file    Attends meetings of clubs or organizations: Not on file    Relationship status: Not on file  . Intimate partner violence:    Fear of current or ex partner: Not on file    Emotionally abused: Not on file    Physically abused: Not on file    Forced sexual activity: Not on file  Other Topics Concern  . Not on file  Social History Narrative  . Not on file    ROS General: Denies fever, chills, night sweats, changes in weight, changes in appetite HEENT: Denies headaches, ear pain, changes in vision, rhinorrhea, sore throat CV: Denies CP, palpitations, SOB, orthopnea Pulm: Denies SOB, cough, wheezing GI: Denies abdominal pain, nausea, vomiting, diarrhea, constipation GU: Denies dysuria, hematuria, frequency, vaginal discharge Msk: Denies muscle cramps, joint pains Neuro: Denies weakness, numbness, tingling Skin: Denies rashes, bruising Psych: Denies depression, anxiety, hallucinations  BP 128/88 (BP Location: Left  Arm, Patient Position: Sitting, Cuff Size: Normal)   Temp 98.6 F (37 C) (Oral)   Ht 5\' 5"  (1.651 m)   Wt 176 lb (79.8 kg)   LMP 03/12/2018 (Exact Date)   BMI 29.29 kg/m   Physical Exam Gen. Pleasant, well developed, well-nourished, in NAD HEENT - [place]/AT, PERRL, no scleral icterus, no nasal drainage, pharynx without erythema or exudate.  Left TM normal.  Right canal occluded with cerumen.  Right TM normal after irrigation Lungs: no use of accessory muscles, CTAB, no wheezes, rales or rhonchi Cardiovascular: RRR, No r/g/m, no peripheral edema Neuro:  A&Ox3, CN II-XII intact, normal gait Skin:  Warm, dry,  intact, no lesions  No results found for this or any previous visit (from the past 2160 hour(s)).  Assessment/Plan: Impacted cerumen of right ear -Consent obtained.  Right ear irrigated.  Patient tolerated procedure well. -Given handout  Former smoker -Patient congratulated -Encouraged to continue smoking cessation -We will continue to monitor  Encounter to establish care -We reviewed the PMH, PSH, FH, SH, Meds and Allergies. -We provided refills for any medications we will prescribe as needed. -We addressed current concerns per orders and patient instructions. -We have asked for records for pertinent exams, studies, vaccines and notes from previous providers. -We have advised patient to follow up per instructions below.  F/u prn  Abbe Amsterdam, MD

## 2018-03-17 NOTE — Patient Instructions (Signed)
Earwax Buildup, Adult  The ears produce a substance called earwax that helps keep bacteria out of the ear and protects the skin in the ear canal. Occasionally, earwax can build up in the ear and cause discomfort or hearing loss.  What increases the risk?  This condition is more likely to develop in people who:  · Are female.  · Are elderly.  · Naturally produce more earwax.  · Clean their ears often with cotton swabs.  · Use earplugs often.  · Use in-ear headphones often.  · Wear hearing aids.  · Have narrow ear canals.  · Have earwax that is overly thick or sticky.  · Have eczema.  · Are dehydrated.  · Have excess hair in the ear canal.  What are the signs or symptoms?  Symptoms of this condition include:  · Reduced or muffled hearing.  · A feeling of fullness in the ear or feeling that the ear is plugged.  · Fluid coming from the ear.  · Ear pain.  · Ear itch.  · Ringing in the ear.  · Coughing.  · An obvious piece of earwax that can be seen inside the ear canal.  How is this diagnosed?  This condition may be diagnosed based on:  · Your symptoms.  · Your medical history.  · An ear exam. During the exam, your health care provider will look into your ear with an instrument called an otoscope.  You may have tests, including a hearing test.  How is this treated?  This condition may be treated by:  · Using ear drops to soften the earwax.  · Having the earwax removed by a health care provider. The health care provider may:  ? Flush the ear with water.  ? Use an instrument that has a loop on the end (curette).  ? Use a suction device.  · Surgery to remove the wax buildup. This may be done in severe cases.  Follow these instructions at home:    · Take over-the-counter and prescription medicines only as told by your health care provider.  · Do not put any objects, including cotton swabs, into your ear. You can clean the opening of your ear canal with a washcloth or facial tissue.  · Follow instructions from your health care  provider about cleaning your ears. Do not over-clean your ears.  · Drink enough fluid to keep your urine clear or pale yellow. This will help to thin the earwax.  · Keep all follow-up visits as told by your health care provider. If earwax builds up in your ears often or if you use hearing aids, consider seeing your health care provider for routine, preventive ear cleanings. Ask your health care provider how often you should schedule your cleanings.  · If you have hearing aids, clean them according to instructions from the manufacturer and your health care provider.  Contact a health care provider if:  · You have ear pain.  · You develop a fever.  · You have blood, pus, or other fluid coming from your ear.  · You have hearing loss.  · You have ringing in your ears that does not go away.  · Your symptoms do not improve with treatment.  · You feel like the room is spinning (vertigo).  Summary  · Earwax can build up in the ear and cause discomfort or hearing loss.  · The most common symptoms of this condition include reduced or muffled hearing and a feeling of   fullness in the ear or feeling that the ear is plugged.  · This condition may be diagnosed based on your symptoms, your medical history, and an ear exam.  · This condition may be treated by using ear drops to soften the earwax or by having the earwax removed by a health care provider.  · Do not put any objects, including cotton swabs, into your ear. You can clean the opening of your ear canal with a washcloth or facial tissue.  This information is not intended to replace advice given to you by your health care provider. Make sure you discuss any questions you have with your health care provider.  Document Released: 01/29/2004 Document Revised: 12/02/2016 Document Reviewed: 03/03/2016  [employer]vier Interactive Patient Education © 2019 [employer]

## 2018-03-21 ENCOUNTER — Encounter: Payer: Self-pay | Admitting: Family Medicine

## 2018-12-16 ENCOUNTER — Telehealth: Payer: Managed Care, Other (non HMO) | Admitting: Nurse Practitioner

## 2018-12-16 DIAGNOSIS — Z20822 Contact with and (suspected) exposure to covid-19: Secondary | ICD-10-CM

## 2018-12-16 DIAGNOSIS — Z20828 Contact with and (suspected) exposure to other viral communicable diseases: Secondary | ICD-10-CM

## 2018-12-16 DIAGNOSIS — R5081 Fever presenting with conditions classified elsewhere: Secondary | ICD-10-CM

## 2018-12-16 NOTE — Progress Notes (Signed)
E-Visit for Corona Virus Screening   Your current symptoms could be consistent with the coronavirus.  Many health care providers can now test patients at their office but not all are.  [facility] has multiple testing sites. For information on our COVID testing locations and hours go to [email or web address]  No appointments currently needed. Appointment scheduling begins Dec 15th.   Testing Information: The COVID-19 Community Testing sites will begin testing BY APPOINTMENT ONLY starting the week of December 14th (see go-live dates by location below).  You can begin scheduling online 12/15/2018 at [email or web address]. If you do not have access to a smart phone or computer you may call [phone] . In addition, starting the week of December 14th we will move INDOORS for testing (see locations below). . Community testing site appointment hours will be 8 a.m. to 3:30 p.m.   Testing Locations: [place] will begin Tuesday December 15th. Closed on Monday December 14th to relocate indoors to [address] [place] [address] [facility] Appointments will begin Wednesday December 16th. Closed on Tuesday December 15th for move to indoors at [place]. [address] [place], [place] [address] [facility] Appointments will begin Wednesday December 16th. Closed on Tuesday December 15th for move to indoors at [address], [place] [address]   We are enrolling you in our [facility] for [place] . Daily you will receive a questionnaire within the [facility] website. Our COVID 19 response team will be monitoring your responses daily.  Please quarantine yourself while awaiting your test results. If you develop fever/cough/breathlessness, please stay home for 10 days with improving symptoms and until you have had 24 hours of no fever (without taking a fever reducer).  You should wear a mask or cloth face covering over your nose and mouth if you must be around  other people or animals, including pets (even at home). Try to stay at least 6 feet away from other people. This will protect the people around you.  Please continue good preventive care measures, including:  frequent hand-washing, avoid touching your face, cover coughs/sneezes, stay out of crowds and keep a 6 foot distance from others.  COVID-19 is a respiratory illness with symptoms that are similar to the flu. Symptoms are typically mild to moderate, but there have been cases of severe illness and death due to the virus.   The following symptoms may appear 2-14 days after exposure: . Fever . Cough . Shortness of breath or difficulty breathing . Chills . Repeated shaking with chills . Muscle pain . Headache . Sore throat . New loss of taste or smell . Fatigue . Congestion or runny nose . Nausea or vomiting . Diarrhea  Go to the nearest hospital ED for assessment if fever/cough/breathlessness are severe or illness seems like a threat to life.  It is vitally important that if you feel that you have an infection such as this virus or any other virus that you stay home and away from places where you may spread it to others.  You should avoid contact with people age 76 and older.   You can use medication such as delsym if develop a cough  You may also take acetaminophen (Tylenol) as needed for fever.  Reduce your risk of any infection by using the same precautions used for avoiding the common cold or flu:  Marland Kitchen Wash your hands often with soap and warm water for at least 20 seconds.  If soap and water are not readily  available, use an alcohol-based hand sanitizer with at least 60% alcohol.  . If coughing or sneezing, cover your mouth and nose by coughing or sneezing into the elbow areas of your shirt or coat, into a tissue or into your sleeve (not your hands). . Avoid shaking hands with others and consider head nods or verbal greetings only. . Avoid touching your eyes, nose, or mouth with  unwashed hands.  . Avoid close contact with people who are sick. . Avoid places or events with large numbers of people in one location, like concerts or sporting events. . Carefully consider travel plans you have or are making. . If you are planning any travel outside or inside the [place], visit the CDC's Travelers' Health webpage for the latest health notices. . If you have some symptoms but not all symptoms, continue to monitor at home and seek medical attention if your symptoms worsen. . If you are having a medical emergency, call 911.  HOME CARE . Only take medications as instructed by your medical team. . Drink plenty of fluids and get plenty of rest. . A steam or ultrasonic humidifier can help if you have congestion.   GET HELP RIGHT AWAY IF YOU HAVE EMERGENCY WARNING SIGNS** FOR COVID-19. If you or someone is showing any of these signs seek emergency medical care immediately. Call 911 or proceed to your closest emergency facility if: . You develop worsening high fever. . Trouble breathing . Bluish lips or face . Persistent pain or pressure in the chest . New confusion . Inability to wake or stay awake . You cough up blood. . Your symptoms become more severe  **This list is not all possible symptoms. Contact your medical provider for any symptoms that are sever or concerning to you.  MAKE SURE YOU   Understand these instructions.  Will watch your condition.  Will get help right away if you are not doing well or get worse.  Your e-visit answers were reviewed by a board certified advanced clinical practitioner to complete your personal care plan.  Depending on the condition, your plan could have included both over the counter or prescription medications.  If there is a problem please reply once you have received a response from your provider.  Your safety is important to [place].  If you have drug allergies check your prescription carefully.    You can use MyChart to ask questions  about today's visit, request a non-urgent call back, or ask for a work or school excuse for 24 hours related to this e-Visit. If it has been greater than 24 hours you will need to follow up with your provider, or enter a new e-Visit to address those concerns. You will get an e-mail in the next two days asking about your experience.  I hope that your e-visit has been valuable and will speed your recovery. Thank you for using e-visits.   5-10 minutes spent reviewing and documenting in chart.

## 2019-02-01 ENCOUNTER — Other Ambulatory Visit: Payer: Self-pay

## 2019-02-01 ENCOUNTER — Ambulatory Visit: Payer: Managed Care, Other (non HMO) | Admitting: Family Medicine

## 2019-02-01 ENCOUNTER — Encounter: Payer: Self-pay | Admitting: Family Medicine

## 2019-02-01 VITALS — BP 126/84 | HR 82 | Temp 97.9°F | Wt 184.0 lb

## 2019-02-01 DIAGNOSIS — Z0000 Encounter for general adult medical examination without abnormal findings: Secondary | ICD-10-CM

## 2019-02-01 DIAGNOSIS — Z113 Encounter for screening for infections with a predominantly sexual mode of transmission: Secondary | ICD-10-CM

## 2019-02-01 DIAGNOSIS — Z131 Encounter for screening for diabetes mellitus: Secondary | ICD-10-CM | POA: Diagnosis not present

## 2019-02-01 DIAGNOSIS — Z13220 Encounter for screening for lipoid disorders: Secondary | ICD-10-CM

## 2019-02-01 DIAGNOSIS — E049 Nontoxic goiter, unspecified: Secondary | ICD-10-CM | POA: Diagnosis not present

## 2019-02-01 LAB — CBC WITH DIFFERENTIAL/PLATELET
Basophils Absolute: 0.1 10*3/uL (ref 0.0–0.1)
Basophils Relative: 0.5 % (ref 0.0–3.0)
Eosinophils Absolute: 0.1 10*3/uL (ref 0.0–0.7)
Eosinophils Relative: 0.6 % (ref 0.0–5.0)
HCT: 41.9 % (ref 36.0–46.0)
Hemoglobin: 13.6 g/dL (ref 12.0–15.0)
Lymphocytes Relative: 32.3 % (ref 12.0–46.0)
Lymphs Abs: 3.3 10*3/uL (ref 0.7–4.0)
MCHC: 32.4 g/dL (ref 30.0–36.0)
MCV: 85.7 fl (ref 78.0–100.0)
Monocytes Absolute: 0.8 10*3/uL (ref 0.1–1.0)
Monocytes Relative: 7.7 % (ref 3.0–12.0)
Neutro Abs: 6 10*3/uL (ref 1.4–7.7)
Neutrophils Relative %: 58.9 % (ref 43.0–77.0)
Platelets: 353 10*3/uL (ref 150.0–400.0)
RBC: 4.89 Mil/uL (ref 3.87–5.11)
RDW: 13.3 % (ref 11.5–15.5)
WBC: 10.1 10*3/uL (ref 4.0–10.5)

## 2019-02-01 LAB — LIPID PANEL
Cholesterol: 200 mg/dL (ref 0–200)
HDL: 54 mg/dL (ref >39.00)
LDL Cholesterol: 127 mg/dL — ABNORMAL HIGH (ref 0–99)
NonHDL: 145.92
Total CHOL/HDL Ratio: 4
Triglycerides: 93 mg/dL (ref 0.0–149.0)
VLDL: 18.6 mg/dL (ref 0.0–40.0)

## 2019-02-01 LAB — BASIC METABOLIC PANEL
BUN: 10 mg/dL (ref 6–23)
CO2: 27 mEq/L (ref 19–32)
Calcium: 9.9 mg/dL (ref 8.4–10.5)
Chloride: 103 mEq/L (ref 96–112)
Creatinine, Ser: 0.8 mg/dL (ref 0.40–1.20)
GFR: 103.11 mL/min (ref >60.00)
Glucose, Bld: 96 mg/dL (ref 70–99)
Potassium: 3.4 mEq/L — ABNORMAL LOW (ref 3.5–5.1)
Sodium: 137 mEq/L (ref 135–145)

## 2019-02-01 LAB — TSH: TSH: 2.2 u[IU]/mL (ref 0.35–4.50)

## 2019-02-01 LAB — HEMOGLOBIN A1C: Hgb A1c MFr Bld: 5.7 % (ref 4.6–6.5)

## 2019-02-01 LAB — T4, FREE: Free T4: 0.66 ng/dL (ref 0.60–1.60)

## 2019-02-01 NOTE — Patient Instructions (Signed)
Preventive Care 29-29 Years Old, Female Preventive care refers to visits with your health care provider and lifestyle choices that can promote health and wellness. This includes:  A yearly physical exam. This may also be called an annual well check.  Regular dental visits and eye exams.  Immunizations.  Screening for certain conditions.  Healthy lifestyle choices, such as eating a healthy diet, getting regular exercise, not using drugs or products that contain nicotine and tobacco, and limiting alcohol use. What can I expect for my preventive care visit? Physical exam Your health care provider will check your:  Height and weight. This may be used to calculate body mass index (BMI), which tells if you are at a healthy weight.  Heart rate and blood pressure.  Skin for abnormal spots. Counseling Your health care provider may ask you questions about your:  Alcohol, tobacco, and drug use.  Emotional well-being.  Home and relationship well-being.  Sexual activity.  Eating habits.  Work and work Statistician.  Method of birth control.  Menstrual cycle.  Pregnancy history. What immunizations do I need?  Influenza (flu) vaccine  This is recommended every year. Tetanus, diphtheria, and pertussis (Tdap) vaccine  You may need a Td booster every 10 years. Varicella (chickenpox) vaccine  You may need this if you have not been vaccinated. Human papillomavirus (HPV) vaccine  If recommended by your health care provider, you may need three doses over 6 months. Measles, mumps, and rubella (MMR) vaccine  You may need at least one dose of MMR. You may also need a second dose. Meningococcal conjugate (MenACWY) vaccine  One dose is recommended if you are age 67-21 years and a first-year college student living in a residence hall, or if you have one of several medical conditions. You may also need additional booster doses. Pneumococcal conjugate (PCV13) vaccine  You may need  this if you have certain conditions and were not previously vaccinated. Pneumococcal polysaccharide (PPSV23) vaccine  You may need one or two doses if you smoke cigarettes or if you have certain conditions. Hepatitis A vaccine  You may need this if you have certain conditions or if you travel or work in places where you may be exposed to hepatitis A. Hepatitis B vaccine  You may need this if you have certain conditions or if you travel or work in places where you may be exposed to hepatitis B. Haemophilus influenzae type b (Hib) vaccine  You may need this if you have certain conditions. You may receive vaccines as individual doses or as more than one vaccine together in one shot (combination vaccines). Talk with your health care provider about the risks and benefits of combination vaccines. What tests do I need?  Blood tests  Lipid and cholesterol levels. These may be checked every 5 years starting at age 68.  Hepatitis C test.  Hepatitis B test. Screening  Diabetes screening. This is done by checking your blood sugar (glucose) after you have not eaten for a while (fasting).  Sexually transmitted disease (STD) testing.  BRCA-related cancer screening. This may be done if you have a family history of breast, ovarian, tubal, or peritoneal cancers.  Pelvic exam and Pap test. This may be done every 3 years starting at age 57. Starting at age 1, this may be done every 5 years if you have a Pap test in combination with an HPV test. Talk with your health care provider about your test results, treatment options, and if necessary, the need for more tests.  Follow these instructions at home: Eating and drinking   Eat a diet that includes fresh fruits and vegetables, whole grains, lean protein, and low-fat dairy.  Take vitamin and mineral supplements as recommended by your health care provider.  Do not drink alcohol if: ? Your health care provider tells you not to drink. ? You are  pregnant, may be pregnant, or are planning to become pregnant.  If you drink alcohol: ? Limit how much you have to 0-1 drink a day. ? Be aware of how much alcohol is in your drink. In the [place] one drink equals one 12 oz bottle of beer (355 mL), one 5 oz glass of wine (148 mL), or one 1 oz glass of hard liquor (44 mL). Lifestyle  Take daily care of your teeth and gums.  Stay active. Exercise for at least 30 minutes on 5 or more days each week.  Do not use any products that contain nicotine or tobacco, such as cigarettes, e-cigarettes, and chewing tobacco. If you need help quitting, ask your health care provider.  If you are sexually active, practice safe sex. Use a condom or other form of birth control (contraception) in order to prevent pregnancy and STIs (sexually transmitted infections). If you plan to become pregnant, see your health care provider for a preconception visit. What's next?  Visit your health care provider once a year for a well check visit.  Ask your health care provider how often you should have your eyes and teeth checked.  Stay up to date on all vaccines. This information is not intended to replace advice given to you by your health care provider. Make sure you discuss any questions you have with your health care provider. Document Revised: 09/01/2017 Document Reviewed: 09/01/2017 Elsevier Patient Education  [place]  A goiter is an enlarged thyroid gland. The thyroid is located in the lower front of the neck. It makes hormones that affect many body parts and systems, including the system that affects how quickly the body burns fuel for energy (metabolism). Most goiters are painless and are not a cause for concern. Some goiters can affect the way your thyroid makes thyroid hormones. Goiters and conditions that cause goiters can be treated, if necessary. What are the causes? Common causes of this condition include:  Lack (deficiency) of a  mineral called iodine. The thyroid gland uses iodine to make thyroid hormones.  Diseases that attack healthy cells in the body (autoimmune diseases) and affect thyroid function, such as Graves' disease or Hashimoto's disease. These diseases may cause the body to produce too much thyroid hormone (hyperthyroidism) or too little of the hormone (hypothyroidism).  Conditions that cause inflammation of the thyroid (thyroiditis).  One or more small growths on the thyroid (nodular goiter). Other causes include:  Medical problems caused by abnormal genes that are passed from parent to child (genetic defects).  Thyroid injury or infection.  Tumors that may or may not be cancerous.  Pregnancy.  Certain medicines.  Exposure to radiation. In some cases, the cause may not be known. What increases the risk? This condition is more likely to develop in:  People who do not get enough iodine in their diet.  People who have a family history of goiter.  Women.  People who are older than age 42.  People who smoke tobacco.  People who have had exposure to radiation. What are the signs or symptoms? The main symptom of this condition is swelling in the lower, front  part of the neck. This swelling can range from a very small bump to a large lump. Other symptoms may include:  A tight feeling in the throat.  A hoarse voice.  Coughing.  Wheezing.  Difficulty swallowing or breathing.  Bulging veins in the neck.  Dizziness. When a goiter is the result of an overactive thyroid (hyperthyroidism), symptoms may also include:  Nervousness or restlessness.  Inability to tolerate heat.  Unexplained weight loss.  Diarrhea.  Change in the texture of hair or skin.  Changes in heartbeat, such as skipped beats, extra beats, or a rapid heart rate.  Loss of menstruation.  Shaky hands.  Increased appetite.  Sleep problems. When a goiter is the result of an underactive thyroid  (hypothyroidism), symptoms may also include:  Feeling like you have no energy (lethargy).  Inability to tolerate cold.  Weight gain that is not explained by a change in diet or exercise habits.  Dry skin.  Coarse hair.  Irregular menstrual periods.  Constipation.  Sadness or depression.  Fatigue. In some cases, there may not be any symptoms and the thyroid hormone levels may be normal. How is this diagnosed? This condition may be diagnosed based on your symptoms, your medical history, and a physical exam. You may have tests, such as:  Blood tests to check thyroid function.  Imaging tests, such as: ? Ultrasound. ? CT scan. ? MRI. ? Thyroid scan.  Removal of a tissue sample (biopsy) of the goiter or any nodules. The sample will be tested to check for cancer. How is this treated? Treatment for this condition depends on the cause and your symptoms. Treatment may include:  Medicines to regulate thyroid hormone levels.  Anti-inflammatory medicines or steroid medicines, if the goiter is caused by inflammation.  Iodine supplements or changes to your diet, if the goiter is caused by iodine deficiency.  Radioactive iodine treatment.  Surgery to remove your thyroid. In some cases, you may only need regular check-ups with your health care provider to monitor your condition, and you may not need treatment. Follow these instructions at home:  Follow instructions from your health care provider about any changes to your diet.  Take over-the-counter and prescription medicines only as told by your health care provider. These include supplements.  Do not use any products that contain nicotine or tobacco, such as cigarettes and e-cigarettes. If you need help quitting, ask your health care provider.  Keep all follow-up visits as told by your health care provider. This is important. Contact a health care provider if:  Your symptoms do not get better with treatment.  You have  nausea, vomiting, or diarrhea. Get help right away if:  You have sudden, unexplained confusion or other mental changes.  You have a fever.  You have chest pain.  You have trouble breathing or swallowing.  You suddenly become very weak.  You experience extreme restlessness.  You feel your heart racing. Summary  A goiter is an enlarged thyroid gland.  The thyroid gland is located in the lower front of the neck. It makes hormones that affect many body parts and systems, including the system that affects how quickly the body burns fuel for energy (metabolism).  The main symptom of this condition is swelling in the lower, front part of the neck. This swelling can range from a very small bump to a large lump.  Treatment for this condition depends on the cause and your symptoms. You may need medicines, supplements, or regular monitoring of your  condition. This information is not intended to replace advice given to you by your health care provider. Make sure you discuss any questions you have with your health care provider. Document Revised: 12/03/2016 Document Reviewed: 09/16/2016 Elsevier Patient Education  2020 [employer].

## 2019-02-01 NOTE — Progress Notes (Signed)
Subjective:     Catherine Meyer is a 29 y.o. female and is here for a comprehensive physical exam and labs. The patient reports no problems.  Pt has been doing well overall.  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    Social History   Socioeconomic History  . Marital status: Single    Spouse name: Not on file  . Number of children: Not on file  . Years of education: Not on file  . Highest education level: Not on file  Occupational History  . Not on file  Tobacco Use  . Smoking status: Current Every Day Smoker    Packs/day: 0.25    Types: Cigarettes  . Smokeless tobacco: Never Used  Substance and Sexual Activity  . Alcohol use: Yes  . Drug use: Yes    Types: Marijuana  . Sexual activity: Yes  Other Topics Concern  . Not on file  Social History Narrative  . Not on file   Social Determinants of Health   Financial Resource Strain:   . Difficulty of Paying Living Expenses: Not on file  Food Insecurity:   . Worried About Programme researcher, broadcasting/film/video in the Last Year: Not on file  . Ran Out of Food in the Last Year: Not on file  Transportation Needs:   . Lack of Transportation (Medical): Not on file  . Lack of Transportation (Non-Medical): Not on file  Physical Activity:   . Days of Exercise per Week: Not on file  . Minutes of Exercise per Session: Not on file  Stress:   . Feeling of Stress : Not on file  Social Connections:   . Frequency of Communication with Friends and Family: Not on file  . Frequency of Social Gatherings with Friends and Family: Not on file  . Attends Religious Services: Not on file  . Active Member of Clubs or Organizations: Not on file  . Attends Banker Meetings: Not on file  . Marital Status: Not on file  Intimate Partner Violence:   . Fear of Current or Ex-Partner: Not on file  . Emotionally Abused: Not on file  . Physically Abused: Not on file  . Sexually Abused: Not on file   Health Maintenance  Topic Date Due  .  TETANUS/TDAP  10/08/2009  . PAP-Cervical Cytology Screening  10/09/2011  . PAP SMEAR-Modifier  10/09/2011  . INFLUENZA VACCINE  08/05/2018  . HIV Screening  Completed    The following portions of the patient's history were reviewed and updated as appropriate: allergies, current medications, past family history, past medical history, past social history, past surgical history and problem list.  Review of Systems A comprehensive review of systems was negative.   Objective:    BP 126/84 (BP Location: Left Arm, Patient Position: Sitting, Cuff Size: Normal)   Pulse 82   Temp 97.9 F (36.6 C) (Temporal)   Wt 184 lb (83.5 kg)   SpO2 98%   BMI 30.62 kg/m  General appearance: alert, cooperative and no distress Head: Normocephalic, without obvious abnormality, atraumatic Eyes: conjunctivae/corneas clear. PERRL, EOM's intact. Fundi benign. Ears: normal TM's and external ear canals both ears Nose: Nares normal. Septum midline. Mucosa normal. No drainage or sinus tenderness. Throat: lips, mucosa, and tongue normal; teeth and gums normal Neck: no adenopathy, no carotid bruit, no JVD, supple, symmetrical, trachea midline and R lobe thyroid >L lobe.  no nodules palpated. Lungs: clear to auscultation bilaterally Heart: regular rate and rhythm, S1,  S2 normal, no murmur, click, rub or gallop Abdomen: soft, non-tender; bowel sounds normal; no masses,  no organomegaly Extremities: extremities normal, atraumatic, no cyanosis or edema Pulses: 2+ and symmetric Skin: Skin color, texture, turgor normal. No rashes or lesions Lymph nodes: Cervical, supraclavicular, and axillary nodes normal. Neurologic: Alert and oriented X 3, normal strength and tone. Normal symmetric reflexes. Normal coordination and gait    Assessment:    Healthy female exam with goiter.      Plan:     Anticipatory guidance given including wearing seatbelts, smoke detectors in the home, increasing physical activity, increasing  p.o. intake of water and vegetables. -will obtain labs -will obtain pap at pt 's convenience  -given handout -next CPE in 1 yr See After Visit Summary for Counseling Recommendations    Goiter  - Plan: TSH, T4, Free  Routine screening for STI (sexually transmitted infection)  - Plan: HIV antibody (with reflex), RPR, C. trachomatis/N. gonorrhoeae RNA  Screening for diabetes mellitus  - Plan: Hemoglobin A1c  Screening for cholesterol level  - Plan: Lipid panel  F/u prn  Grier Mitts, MD

## 2019-02-02 ENCOUNTER — Other Ambulatory Visit: Payer: Self-pay | Admitting: Family Medicine

## 2019-02-02 DIAGNOSIS — E876 Hypokalemia: Secondary | ICD-10-CM

## 2019-02-02 LAB — RPR: RPR Ser Ql: NONREACTIVE

## 2019-02-02 LAB — C. TRACHOMATIS/N. GONORRHOEAE RNA
C. trachomatis RNA, TMA: NOT DETECTED
N. gonorrhoeae RNA, TMA: NOT DETECTED

## 2019-02-02 LAB — HIV ANTIBODY (ROUTINE TESTING W REFLEX): HIV 1&2 Ab, 4th Generation: NONREACTIVE

## 2019-02-02 MED ORDER — POTASSIUM CHLORIDE CRYS ER 20 MEQ PO TBCR: 20.0000 meq | EXTENDED_RELEASE_TABLET | Freq: Every day | ORAL | 0 refills | Status: AC
